# Patient Record
Sex: Female | Born: 1955 | Race: White | Hispanic: No | Smoking: Never smoker
Health system: Southern US, Community
[De-identification: ages and names within clinical notes are randomized; demographics above are authoritative.]

## PROBLEM LIST (undated history)

## (undated) DIAGNOSIS — R112 Nausea with vomiting, unspecified: Secondary | ICD-10-CM

## (undated) DIAGNOSIS — F319 Bipolar disorder, unspecified: Secondary | ICD-10-CM

## (undated) DIAGNOSIS — G2581 Restless legs syndrome: Secondary | ICD-10-CM

## (undated) DIAGNOSIS — Z87442 Personal history of urinary calculi: Secondary | ICD-10-CM

## (undated) DIAGNOSIS — F32A Depression, unspecified: Secondary | ICD-10-CM

## (undated) DIAGNOSIS — F329 Major depressive disorder, single episode, unspecified: Secondary | ICD-10-CM

## (undated) DIAGNOSIS — R569 Unspecified convulsions: Secondary | ICD-10-CM

## (undated) DIAGNOSIS — Z9889 Other specified postprocedural states: Secondary | ICD-10-CM

## (undated) DIAGNOSIS — I10 Essential (primary) hypertension: Secondary | ICD-10-CM

## (undated) DIAGNOSIS — N200 Calculus of kidney: Secondary | ICD-10-CM

## (undated) DIAGNOSIS — E7800 Pure hypercholesterolemia, unspecified: Secondary | ICD-10-CM

## (undated) DIAGNOSIS — F419 Anxiety disorder, unspecified: Secondary | ICD-10-CM

## (undated) DIAGNOSIS — M1990 Unspecified osteoarthritis, unspecified site: Secondary | ICD-10-CM

## (undated) DIAGNOSIS — K219 Gastro-esophageal reflux disease without esophagitis: Secondary | ICD-10-CM

## (undated) HISTORY — PX: JOINT REPLACEMENT: SHX530

## (undated) HISTORY — PX: CHOLECYSTECTOMY: SHX55

## (undated) HISTORY — PX: TONSILLECTOMY: SUR1361

---
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
## 1989-12-23 DIAGNOSIS — R112 Nausea with vomiting, unspecified: Secondary | ICD-10-CM

## 1989-12-23 DIAGNOSIS — Z9889 Other specified postprocedural states: Secondary | ICD-10-CM

## 1989-12-23 HISTORY — DX: Nausea with vomiting, unspecified: R11.2

## 1989-12-23 HISTORY — DX: Other specified postprocedural states: Z98.890

## 1989-12-23 HISTORY — PX: BRAIN SURGERY: SHX531

## 2004-10-01 ENCOUNTER — Ambulatory Visit: Payer: Self-pay | Admitting: Family Medicine

## 2006-04-08 ENCOUNTER — Ambulatory Visit: Payer: Self-pay | Admitting: *Deleted

## 2006-09-12 ENCOUNTER — Ambulatory Visit: Payer: Self-pay | Admitting: *Deleted

## 2006-09-25 ENCOUNTER — Ambulatory Visit: Payer: Self-pay | Admitting: *Deleted

## 2007-02-09 ENCOUNTER — Inpatient Hospital Stay: Payer: Self-pay | Admitting: Internal Medicine

## 2007-02-09 ENCOUNTER — Other Ambulatory Visit: Payer: Self-pay

## 2007-02-19 ENCOUNTER — Ambulatory Visit: Payer: Self-pay | Admitting: *Deleted

## 2007-06-11 ENCOUNTER — Ambulatory Visit: Payer: Self-pay | Admitting: Urology

## 2007-10-13 ENCOUNTER — Ambulatory Visit: Payer: Self-pay | Admitting: *Deleted

## 2007-12-09 ENCOUNTER — Ambulatory Visit: Payer: Self-pay | Admitting: Urology

## 2008-03-17 ENCOUNTER — Ambulatory Visit: Payer: Self-pay | Admitting: Family Medicine

## 2008-05-13 IMAGING — CT CT ABDOMEN AND PELVIS WITHOUT AND WITH CONTRAST
2 of 4 series · 14 of 32 positions shown, 19 images · non-contrast
Comparison: none

REASON FOR EXAM: (1) Hydronephrosis, Colic, Nephrolithiasis; (2)
Hydronephrosis, Colic, Nephrolit
COMMENTS:

[Series 3: with · axial · 0.71mm/px · z∈[-871,-535]mm · 8 of 56 slices shown, 13 images]
[im 7/56  soft-tissue]
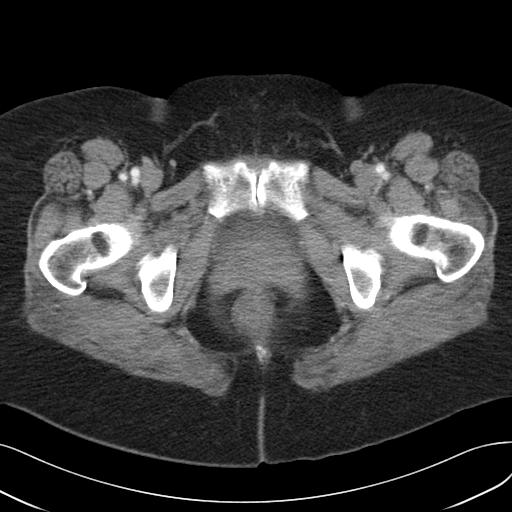
[im 7/56  bone]
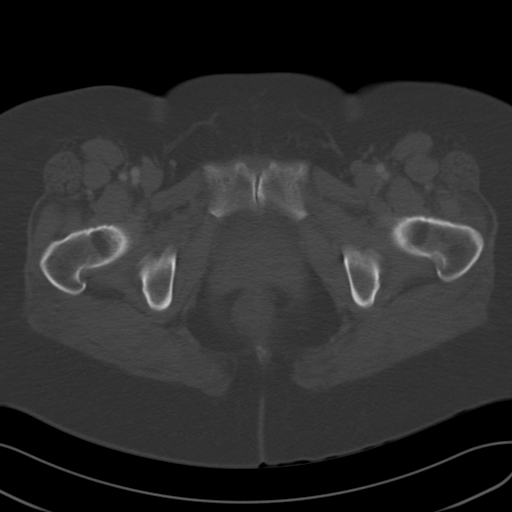
[im 13/56  soft-tissue]
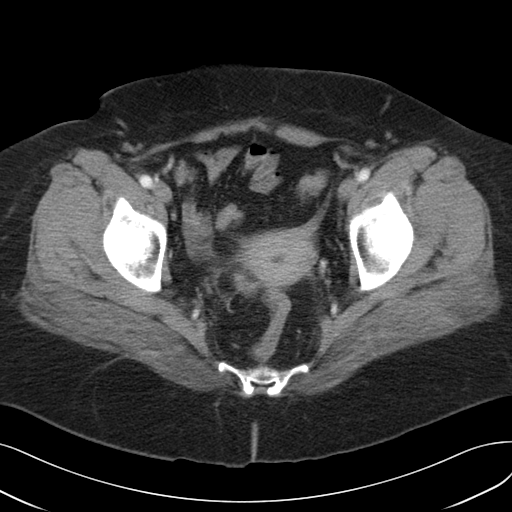
[im 19/56  soft-tissue]
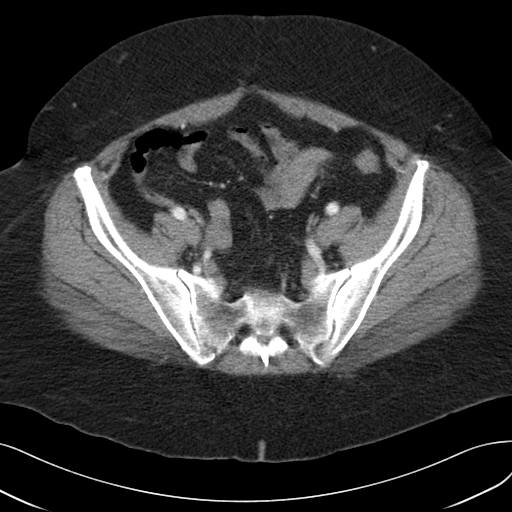
[im 25/56  soft-tissue]
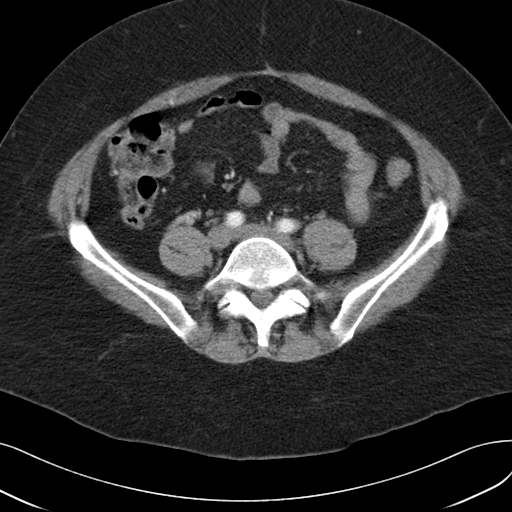
[im 31/56  soft-tissue]
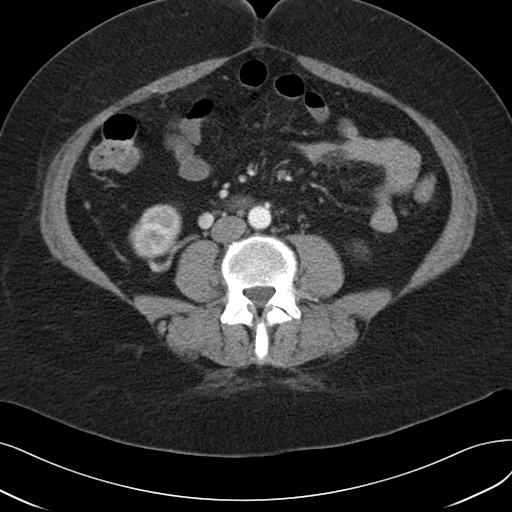
[im 31/56  lung]
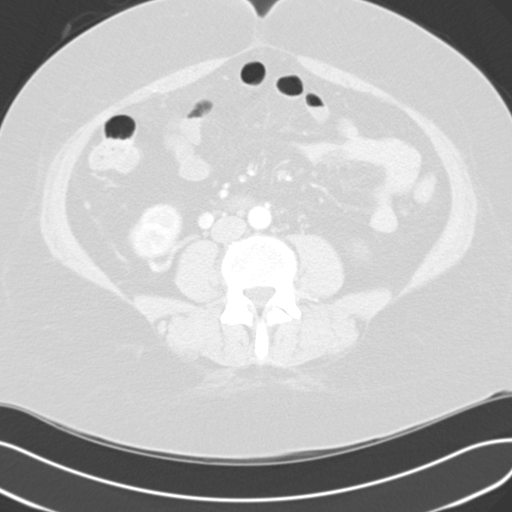
[im 37/56  soft-tissue]
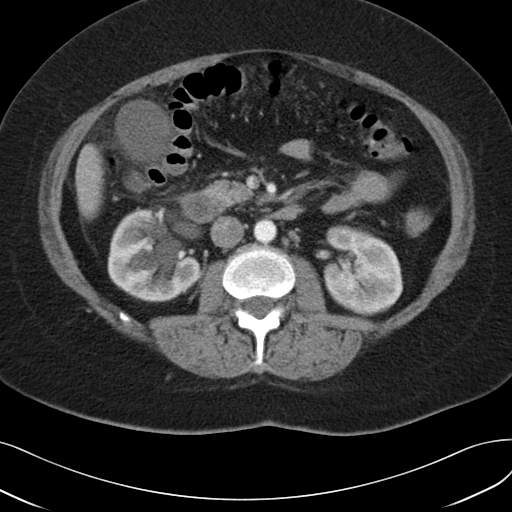
[im 37/56  lung]
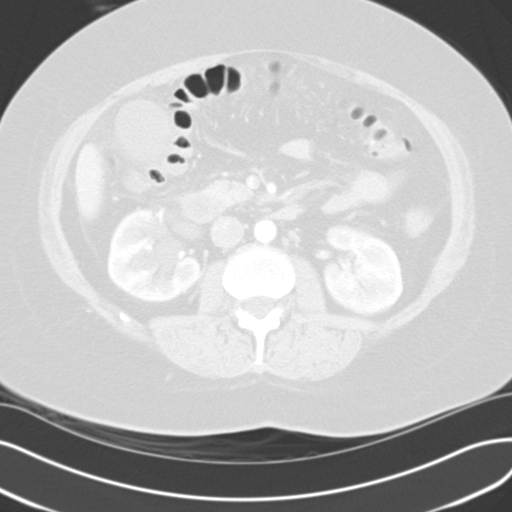
[im 43/56  soft-tissue]
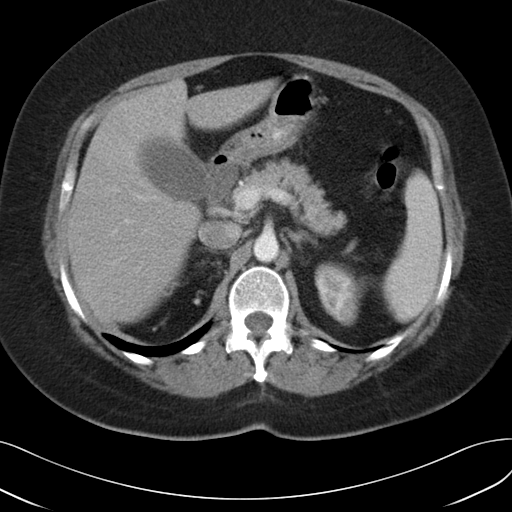
[im 43/56  lung]
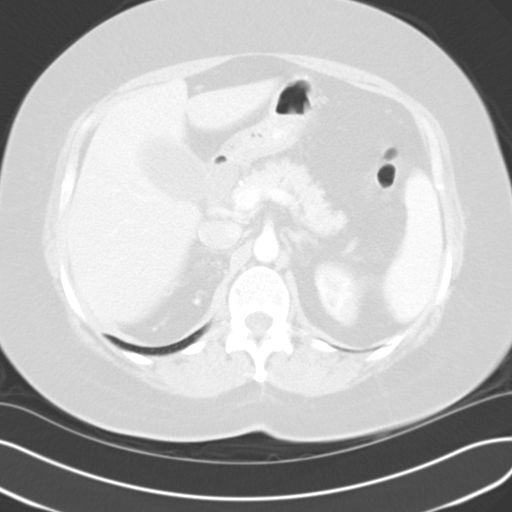
[im 49/56  soft-tissue]
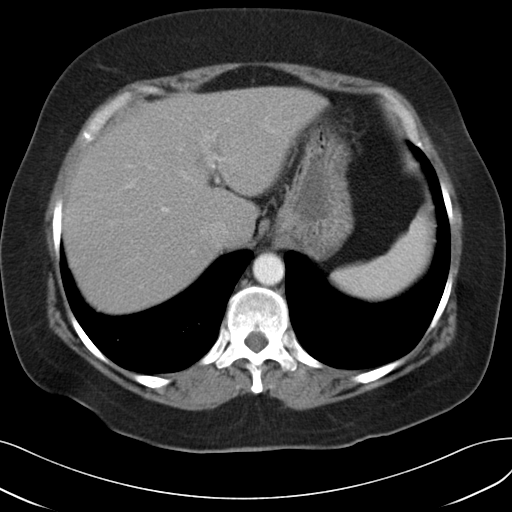
[im 49/56  lung]
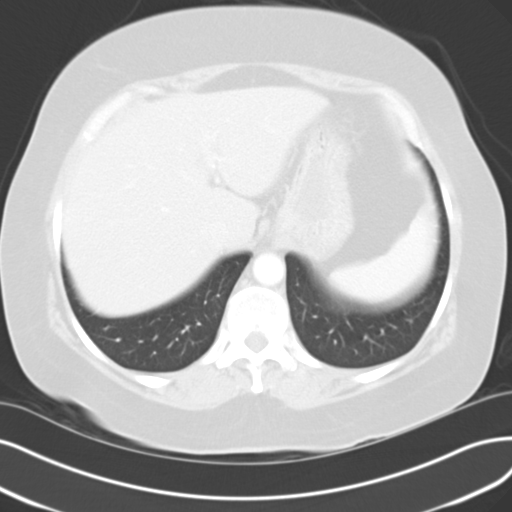

[Series 4: delay · axial · delayed · 0.71mm/px · z∈[-871,-631]mm · 6 of 56 slices shown]
[im 7/56  soft-tissue]
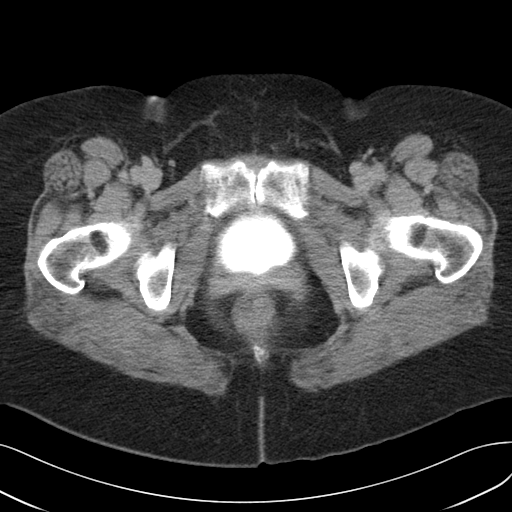
[im 13/56  soft-tissue]
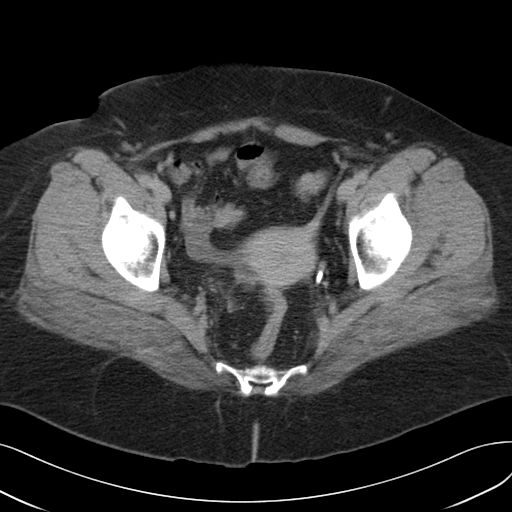
[im 19/56  soft-tissue]
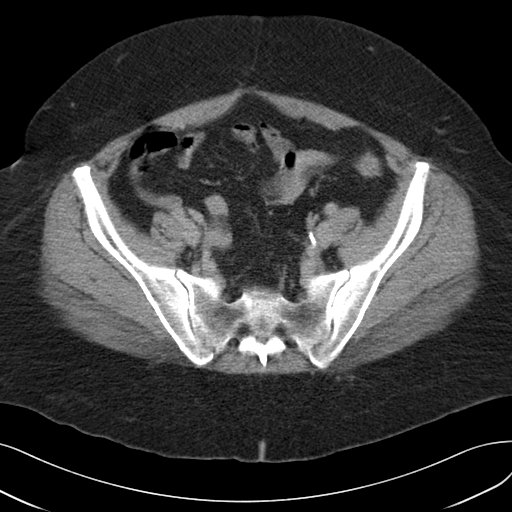
[im 25/56  soft-tissue]
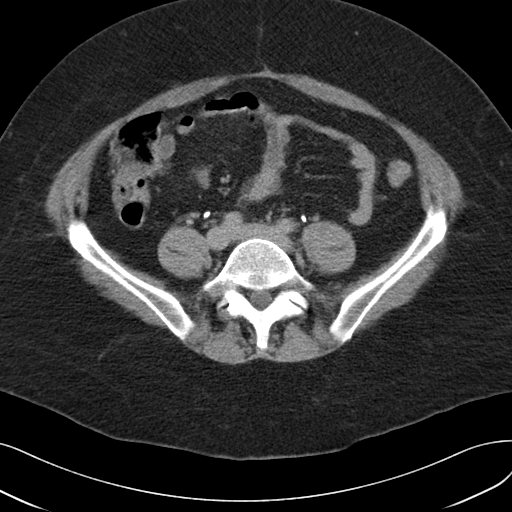
[im 31/56  soft-tissue]
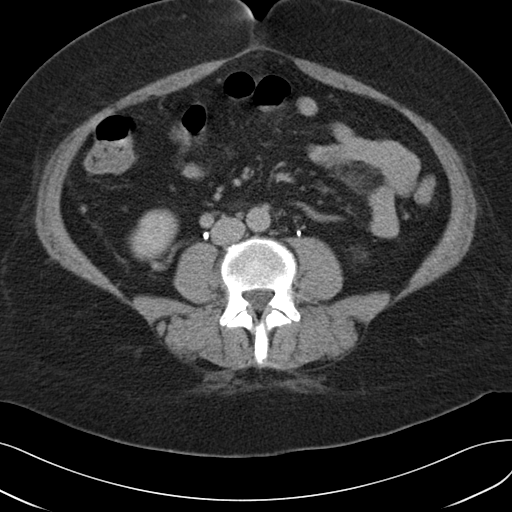
[im 37/56  soft-tissue]
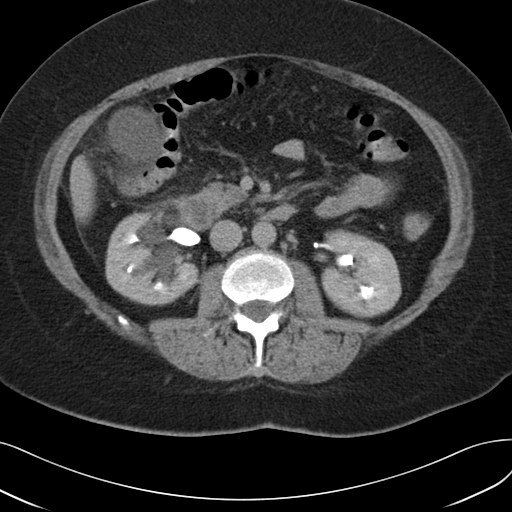

[14 of 32 positions shown; findings below may reference images not displayed]

PROCEDURE:     CT  - CT ABDOMEN / PELVIS  W/WO  - February 11, 2007  [DATE]

RESULT:     Triphasic multislice helical acquisition through the abdomen and
pelvis is performed. The precontrast images show a small stone anteriorly in
the midportion of the right kidney on image 24 measuring approximately 3 mm
in diameter. No additional stones are evident. Delayed images following
contrast administration showed multiple parapelvic cysts on the right. There
is no hydroureter or hydronephrosis present. The uterus is unremarkable in
appearance. The bladder opacifies with contrast-enhanced urine on the
delayed images. The lung bases are clear. The kidneys show no focal masses.
The liver and spleen appear normal. Pancreas is unremarkable. The
gallbladder is present. No radiopaque gallstones are evident. There is no
inflammatory stranding. No abnormal bowel distention is present. There is no
free fluid, free air or loculated fluid collection.
IMPRESSION: 1. Nonobstructing calyceal stone in the right kidney.
2. Multiple parapelvic cysts on the right. No evidence of hydroureter or
hydronephrosis.

## 2009-03-20 ENCOUNTER — Ambulatory Visit: Payer: Self-pay | Admitting: Family Medicine

## 2009-10-09 ENCOUNTER — Ambulatory Visit: Payer: Self-pay | Admitting: Family Medicine

## 2009-11-28 ENCOUNTER — Ambulatory Visit: Payer: Self-pay | Admitting: Family Medicine

## 2010-05-14 ENCOUNTER — Ambulatory Visit: Payer: Self-pay | Admitting: Family Medicine

## 2011-08-15 ENCOUNTER — Ambulatory Visit: Payer: Self-pay | Admitting: Family Medicine

## 2012-08-18 ENCOUNTER — Ambulatory Visit: Payer: Self-pay | Admitting: Family Medicine

## 2013-08-20 ENCOUNTER — Ambulatory Visit: Payer: Self-pay | Admitting: Family Medicine

## 2013-08-25 ENCOUNTER — Ambulatory Visit: Payer: Self-pay | Admitting: Family Medicine

## 2013-09-17 ENCOUNTER — Ambulatory Visit: Payer: Self-pay | Admitting: Family Medicine

## 2014-06-19 ENCOUNTER — Ambulatory Visit: Payer: Self-pay | Admitting: Physician Assistant

## 2014-06-26 ENCOUNTER — Emergency Department: Payer: Self-pay | Admitting: Emergency Medicine

## 2014-06-27 LAB — BASIC METABOLIC PANEL
ANION GAP: 7 (ref 7–16)
BUN: 30 mg/dL — AB (ref 7–18)
CHLORIDE: 102 mmol/L (ref 98–107)
CO2: 28 mmol/L (ref 21–32)
CREATININE: 0.87 mg/dL (ref 0.60–1.30)
Calcium, Total: 9.4 mg/dL (ref 8.5–10.1)
EGFR (Non-African Amer.): >60
Glucose: 107 mg/dL — ABNORMAL HIGH (ref 65–99)
Osmolality: 280 (ref 275–301)
Potassium: 3.3 mmol/L — ABNORMAL LOW (ref 3.5–5.1)
SODIUM: 137 mmol/L (ref 136–145)

## 2014-06-27 LAB — CBC
HCT: 44.7 % (ref 35.0–47.0)
HGB: 14.5 g/dL (ref 12.0–16.0)
MCH: 30.2 pg (ref 26.0–34.0)
MCHC: 32.4 g/dL (ref 32.0–36.0)
MCV: 93 fL (ref 80–100)
Platelet: 356 10*3/uL (ref 150–440)
RBC: 4.81 10*6/uL (ref 3.80–5.20)
RDW: 13.7 % (ref 11.5–14.5)
WBC: 10 10*3/uL (ref 3.6–11.0)

## 2014-06-27 LAB — D-DIMER(ARMC): D-Dimer: 809 ng/ml

## 2014-07-09 ENCOUNTER — Emergency Department: Payer: Self-pay | Admitting: Internal Medicine

## 2014-07-09 LAB — COMPREHENSIVE METABOLIC PANEL
ALBUMIN: 3.6 g/dL (ref 3.4–5.0)
AST: 25 U/L (ref 15–37)
Alkaline Phosphatase: 103 U/L
Anion Gap: 8 (ref 7–16)
BUN: 12 mg/dL (ref 7–18)
Bilirubin,Total: 0.7 mg/dL (ref 0.2–1.0)
CHLORIDE: 103 mmol/L (ref 98–107)
Calcium, Total: 9.1 mg/dL (ref 8.5–10.1)
Co2: 27 mmol/L (ref 21–32)
Creatinine: 0.84 mg/dL (ref 0.60–1.30)
EGFR (Non-African Amer.): >60
Glucose: 99 mg/dL (ref 65–99)
Osmolality: 275 (ref 275–301)
Potassium: 3.6 mmol/L (ref 3.5–5.1)
SGPT (ALT): 30 U/L (ref 12–78)
SODIUM: 138 mmol/L (ref 136–145)
TOTAL PROTEIN: 7.4 g/dL (ref 6.4–8.2)

## 2014-07-09 LAB — CBC
HCT: 38.9 % (ref 35.0–47.0)
HGB: 12.8 g/dL (ref 12.0–16.0)
MCH: 31 pg (ref 26.0–34.0)
MCHC: 32.9 g/dL (ref 32.0–36.0)
MCV: 94 fL (ref 80–100)
Platelet: 278 10*3/uL (ref 150–440)
RBC: 4.14 10*6/uL (ref 3.80–5.20)
RDW: 13.7 % (ref 11.5–14.5)
WBC: 5 10*3/uL (ref 3.6–11.0)

## 2014-07-09 LAB — PROTIME-INR
INR: 0.9
Prothrombin Time: 12.1 secs (ref 11.5–14.7)

## 2015-01-02 ENCOUNTER — Ambulatory Visit: Payer: Self-pay | Admitting: Family Medicine

## 2015-07-17 ENCOUNTER — Ambulatory Visit
Admission: EM | Admit: 2015-07-17 | Discharge: 2015-07-17 | Disposition: A | Discharge status: Home / Self Care | Payer: Medicare Other | Attending: Internal Medicine | Admitting: Internal Medicine

## 2015-07-17 DIAGNOSIS — R7302 Impaired glucose tolerance (oral): Secondary | ICD-10-CM

## 2015-07-17 DIAGNOSIS — L237 Allergic contact dermatitis due to plants, except food: Secondary | ICD-10-CM | POA: Diagnosis not present

## 2015-07-17 HISTORY — DX: Pure hypercholesterolemia, unspecified: E78.00

## 2015-07-17 HISTORY — DX: Restless legs syndrome: G25.81

## 2015-07-17 HISTORY — DX: Essential (primary) hypertension: I10

## 2015-07-17 MED ORDER — MUPIROCIN 2 % EX OINT: 1.0000 "application " | TOPICAL_OINTMENT | Freq: Two times a day (BID) | CUTANEOUS | Status: DC

## 2015-07-17 MED ORDER — PREDNISONE 50 MG PO TABS: 50.0000 mg | ORAL_TABLET | Freq: Every day | ORAL | Status: DC

## 2015-07-17 MED ORDER — METHYLPREDNISOLONE SODIUM SUCC 125 MG IJ SOLR
125.0000 mg | Freq: Once | INTRAMUSCULAR | Status: AC
  Administered 2015-07-17: 125 mg via INTRAMUSCULAR

## 2015-07-17 NOTE — ED Provider Notes (Signed)
CSN: [id number]     Arrival date & time 07/17/15  2841 History   First MD Initiated Contact with Patient 07/17/15 1059     Chief Complaint  Patient presents with  . Rash   HPI 59 year old lady who presents today after working in the yard several days ago, has now developed multiple itchy red patches on arms and legs. No fever, no malaise, no vomiting. Feels well otherwise.   Past Medical History  Diagnosis Date  . Hypertension   . Restless leg   . Hypercholesteremia        Glucose intolerance, since ?2014, per patient report Past Surgical History  Procedure Laterality Date  . Cesarean section    . Cholecystectomy    . Brain surgery      History  Substance Use Topics  . Smoking status: Never Smoker   . Smokeless tobacco: Not on file  . Alcohol Use: No    Review of Systems  All other systems reviewed and are negative.   Allergies  Mellaril  Home Medications   Prior to Admission medications   Medication Sig Start Date End Date Taking? Authorizing Provider  amitriptyline (ELAVIL) 10 MG tablet Take 10 mg by mouth at bedtime.   Yes Historical Provider, MD  aspirin 81 MG tablet Take 81 mg by mouth daily.   Yes Historical Provider, MD  gabapentin (NEURONTIN) 100 MG capsule Take 100 mg by mouth 3 (three) times daily.   Yes Historical Provider, MD  lisinopril-hydrochlorothiazide (PRINZIDE,ZESTORETIC) 10-12.5 MG per tablet Take 1 tablet by mouth daily.   Yes Historical Provider, MD  propranolol (INDERAL) 10 MG tablet Take 10 mg by mouth 3 (three) times daily.   Yes Historical Provider, MD  venlafaxine (EFFEXOR) 100 MG tablet Take 100 mg by mouth 2 (two) times daily.   Yes Historical Provider, MD  mupirocin ointment (BACTROBAN) 2 % Apply 1 application topically 2 (two) times daily. 07/17/15   Eustace Moore, MD  predniSONE (DELTASONE) 50 MG tablet Take 1 tablet (50 mg total) by mouth daily. 07/17/15   Eustace Moore, MD   BP 123/69 mmHg  Pulse 66  Temp(Src) 97.5 F (36.4 C)  (Tympanic)  Resp 16  Ht  (1.6 m)  Wt 270 lb (122.471 kg)  BMI 47.84 kg/m2  SpO2 99%  LMP  Physical Exam  Constitutional: She is oriented to person, place, and time. No distress.  Alert, nicely groomed Cheerful, sitting up in chair  HENT:  Head: Atraumatic.  Eyes:  Conjugate gaze, no eye redness/drainage  Neck: Neck supple.  Cardiovascular: Normal rate.   Pulmonary/Chest: No respiratory distress.  Abdominal: She exhibits no distension.  Musculoskeletal: Normal range of motion.  Symmetric, 1-2+ puffy bilateral lower leg swelling  Neurological: She is alert and oriented to person, place, and time.  Skin: Skin is warm and dry.  No cyanosis Lower legs and ankles, forearms, with erythematous patches and streaks, many with vesicles  Nursing note and vitals reviewed.   ED Course  Procedures  Solu-Medrol 125 mg IM given at the urgent care  MDM   1. Poison ivy dermatitis   2. Glucose intolerance (impaired glucose tolerance)    patient encouraged to monitor her blood sugars. Prescriptions for prednisone burst and topical Bactroban sent to the pharmacy. Patient should recheck or follow-up PCP/[facility] for increasing redness/pain/seeping, new fever greater than 100.5, or if not improving in several days.    Eustace Moore, MD 07/17/15 [phone]

## 2015-07-17 NOTE — ED Notes (Signed)
Pt states "I have poison oak, I been out cleaning greenery off fence." Rash present over arms and legs.

## 2015-07-17 NOTE — Discharge Instructions (Signed)
Injection of solumedrol (methylprednisolone, 125 mg) was given today. Prescriptions for prednisone and mupirocin ointment (antibacterial) were sent to the pharmacy. Rechecking for increasing redness/pain/seeping, fever >100.5, or if not improving in several days.  Contact Dermatitis Contact dermatitis is a rash that happens when something touches the skin. You touched something that irritates your skin, or you have allergies to something you touched. HOME CARE   Avoid the thing that caused your rash.  Keep your rash away from hot water, soap, sunlight, chemicals, and other things that might bother it.  Do not scratch your rash.  You can take cool baths to help stop itching.  Only take medicine as told by your doctor.  Keep all doctor visits as told. GET HELP RIGHT AWAY IF:   Your rash is not better after 3 days.  Your rash gets worse.  Your rash is puffy (swollen), tender, red, sore, or warm.  You have problems with your medicine. MAKE SURE YOU:   Understand these instructions.  Will watch your condition.  Will get help right away if you are not doing well or get worse. Document Released: 10/06/2009 Document Revised: 03/02/2012 Document Reviewed: 05/14/2011 [facility] Patient Information 2015 [place], [place]. This information is not intended to replace advice given to you by your health care provider. Make sure you discuss any questions you have with your health care provider.

## 2015-11-01 ENCOUNTER — Encounter
Admission: RE | Admit: 2015-11-01 | Discharge: 2015-11-01 | Disposition: A | Discharge status: Home / Self Care | Payer: Medicare Other | Source: Ambulatory Visit | Attending: Surgery | Admitting: Surgery

## 2015-11-01 DIAGNOSIS — Z01810 Encounter for preprocedural cardiovascular examination: Secondary | ICD-10-CM | POA: Diagnosis present

## 2015-11-01 DIAGNOSIS — Z01812 Encounter for preprocedural laboratory examination: Secondary | ICD-10-CM | POA: Diagnosis not present

## 2015-11-01 HISTORY — DX: Nausea with vomiting, unspecified: R11.2

## 2015-11-01 HISTORY — DX: Gastro-esophageal reflux disease without esophagitis: K21.9

## 2015-11-01 HISTORY — DX: Unspecified convulsions: R56.9

## 2015-11-01 HISTORY — DX: Unspecified osteoarthritis, unspecified site: M19.90

## 2015-11-01 HISTORY — DX: Bipolar disorder, unspecified: F31.9

## 2015-11-01 HISTORY — DX: Depression, unspecified: F32.A

## 2015-11-01 HISTORY — DX: Anxiety disorder, unspecified: F41.9

## 2015-11-01 HISTORY — DX: Other specified postprocedural states: Z98.890

## 2015-11-01 HISTORY — DX: Major depressive disorder, single episode, unspecified: F32.9

## 2015-11-01 HISTORY — DX: Calculus of kidney: N20.0

## 2015-11-01 LAB — URINALYSIS COMPLETE WITH MICROSCOPIC (ARMC ONLY)
Bacteria, UA: NONE SEEN
Bilirubin Urine: NEGATIVE
Glucose, UA: NEGATIVE mg/dL
KETONES UR: NEGATIVE mg/dL
Nitrite: NEGATIVE
PH: 7 (ref 5.0–8.0)
PROTEIN: NEGATIVE mg/dL
SPECIFIC GRAVITY, URINE: 1.008 (ref 1.005–1.030)
SQUAMOUS EPITHELIAL / LPF: NONE SEEN

## 2015-11-01 LAB — PROTIME-INR
INR: 1.02
Prothrombin Time: 13.6 seconds (ref 11.4–15.0)

## 2015-11-01 LAB — BASIC METABOLIC PANEL
Anion gap: 3 — ABNORMAL LOW (ref 5–15)
BUN: 15 mg/dL (ref 6–20)
CALCIUM: 9.2 mg/dL (ref 8.9–10.3)
CO2: 30 mmol/L (ref 22–32)
Chloride: 100 mmol/L — ABNORMAL LOW (ref 101–111)
Creatinine, Ser: 0.76 mg/dL (ref 0.44–1.00)
GFR calc Af Amer: >60 mL/min (ref >60)
GFR calc non Af Amer: >60 mL/min (ref >60)
GLUCOSE: 121 mg/dL — AB (ref 65–99)
Potassium: 3.6 mmol/L (ref 3.5–5.1)
Sodium: 133 mmol/L — ABNORMAL LOW (ref 135–145)

## 2015-11-01 LAB — SEDIMENTATION RATE: SED RATE: 44 mm/h — AB (ref 0–30)

## 2015-11-01 LAB — SURGICAL PCR SCREEN
MRSA, PCR: NEGATIVE
Staphylococcus aureus: POSITIVE — AB

## 2015-11-01 LAB — CBC
HCT: 35.4 % (ref 35.0–47.0)
Hemoglobin: 12 g/dL (ref 12.0–16.0)
MCH: 32.1 pg (ref 26.0–34.0)
MCHC: 33.9 g/dL (ref 32.0–36.0)
MCV: 94.7 fL (ref 80.0–100.0)
Platelets: 334 10*3/uL (ref 150–440)
RBC: 3.74 MIL/uL — ABNORMAL LOW (ref 3.80–5.20)
RDW: 13.2 % (ref 11.5–14.5)
WBC: 6.5 10*3/uL (ref 3.6–11.0)

## 2015-11-01 LAB — TYPE AND SCREEN
ABO/RH(D): O POS
Antibody Screen: NEGATIVE

## 2015-11-01 LAB — APTT: aPTT: 27 seconds (ref 24–36)

## 2015-11-01 LAB — ABO/RH: ABO/RH(D): O POS

## 2015-11-01 NOTE — Patient Instructions (Signed)
  Your procedure is scheduled on: Tuesday Nov. 15, 2016. Report to Same Day Surgery. To find out your arrival time please call [phone] between 1PM - 3PM on Monday Nov. 14, 2016.  Remember: Instructions that are not followed completely may result in serious medical risk, up to and including death, or upon the discretion of your surgeon and anesthesiologist your surgery may need to be rescheduled.    _x___ 1. Do not eat food or drink liquids after midnight. No gum chewing or hard candies.     _x___ 2. No Alcohol for 24 hours before or after surgery.   ____ 3. Bring all medications with you on the day of surgery if instructed.    __x__ 4. Notify your doctor if there is any change in your medical condition     (cold, fever, infections).     Do not wear jewelry, make-up, hairpins, clips or nail polish.  Do not wear lotions, powders, or perfumes. You may wear deodorant.  Do not shave 48 hours prior to surgery. Men may shave face and neck.  Do not bring valuables to the hospital.    [facility] any belongings or valuables.               Contacts, dentures or bridgework may not be worn into surgery.  Leave your suitcase in the car. After surgery it may be brought to your room.  For patients admitted to the hospital, discharge time is determined by your treatment team.   Patients discharged the day of surgery will not be allowed to drive home.    Please read over the following fact sheets that you were given:   [facility]  _x___ Take these medicines the morning of surgery with A SIP OF WATER:    1. gabapentin (NEURONTIN)  2. omeprazole (PRILOSEC)  3. propranolol (INDERAL)  4. venlafaxine (EFFEXOR)   ____ Fleet Enema (as directed)   _x___ Use CHG Soap as directed  ____ Use inhalers on the day of surgery  ____ Stop metformin 2 days prior to surgery    ____ Take 1/2 of usual insulin dose the night before surgery and none on the  morning of surgery.   _x___ Stop aspirin now.  _x___ Stop Anti-inflammatories Aleve, Ibuprofen, advil, motrin now. OK to take Tyelnol for pain.   __x__ Stop supplements such as fish oil until after surgery.    ____ Bring C-Pap to the hospital.

## 2015-11-02 LAB — URINE CULTURE: SPECIAL REQUESTS: NORMAL

## 2015-11-02 NOTE — OR Nursing (Signed)
Dr. Binnie RailPoggi's Office notified of the +staph /MRSA swab

## 2015-11-07 ENCOUNTER — Inpatient Hospital Stay: Admission: RE | Admit: 2015-11-07 | Payer: Medicare Other | Source: Ambulatory Visit | Admitting: Surgery

## 2015-11-07 ENCOUNTER — Encounter: Admission: RE | Payer: Self-pay | Source: Ambulatory Visit

## 2015-11-07 SURGERY — ARTHROPLASTY, KNEE, TOTAL
Anesthesia: Choice | Laterality: Left

## 2015-11-21 ENCOUNTER — Inpatient Hospital Stay: Payer: Medicare Other | Admitting: Anesthesiology

## 2015-11-21 ENCOUNTER — Encounter: Payer: Self-pay | Admitting: *Deleted

## 2015-11-21 ENCOUNTER — Encounter
Admission: RE | Disposition: A | Discharge status: Home Health | Payer: Self-pay | Source: Ambulatory Visit | Attending: Surgery

## 2015-11-21 ENCOUNTER — Inpatient Hospital Stay
Admission: RE | Admit: 2015-11-21 | Discharge: 2015-11-23 | DRG: 470 | Disposition: A | Discharge status: Home Health | Payer: Medicare Other | Source: Ambulatory Visit | Attending: Surgery | Admitting: Surgery

## 2015-11-21 ENCOUNTER — Inpatient Hospital Stay: Payer: Medicare Other

## 2015-11-21 DIAGNOSIS — M1712 Unilateral primary osteoarthritis, left knee: Principal | ICD-10-CM | POA: Diagnosis present

## 2015-11-21 DIAGNOSIS — K219 Gastro-esophageal reflux disease without esophagitis: Secondary | ICD-10-CM | POA: Diagnosis present

## 2015-11-21 DIAGNOSIS — M7062 Trochanteric bursitis, left hip: Secondary | ICD-10-CM | POA: Diagnosis present

## 2015-11-21 DIAGNOSIS — Z87442 Personal history of urinary calculi: Secondary | ICD-10-CM | POA: Diagnosis not present

## 2015-11-21 DIAGNOSIS — Z79890 Hormone replacement therapy (postmenopausal): Secondary | ICD-10-CM | POA: Diagnosis not present

## 2015-11-21 DIAGNOSIS — I10 Essential (primary) hypertension: Secondary | ICD-10-CM | POA: Diagnosis present

## 2015-11-21 DIAGNOSIS — Z79899 Other long term (current) drug therapy: Secondary | ICD-10-CM | POA: Diagnosis not present

## 2015-11-21 DIAGNOSIS — F319 Bipolar disorder, unspecified: Secondary | ICD-10-CM | POA: Diagnosis present

## 2015-11-21 DIAGNOSIS — G2581 Restless legs syndrome: Secondary | ICD-10-CM | POA: Diagnosis present

## 2015-11-21 DIAGNOSIS — Z6841 Body Mass Index (BMI) 40.0 and over, adult: Secondary | ICD-10-CM

## 2015-11-21 DIAGNOSIS — Z96652 Presence of left artificial knee joint: Secondary | ICD-10-CM

## 2015-11-21 DIAGNOSIS — F419 Anxiety disorder, unspecified: Secondary | ICD-10-CM | POA: Diagnosis present

## 2015-11-21 DIAGNOSIS — M7061 Trochanteric bursitis, right hip: Secondary | ICD-10-CM | POA: Diagnosis present

## 2015-11-21 DIAGNOSIS — Z7982 Long term (current) use of aspirin: Secondary | ICD-10-CM | POA: Diagnosis not present

## 2015-11-21 DIAGNOSIS — E7800 Pure hypercholesterolemia, unspecified: Secondary | ICD-10-CM | POA: Diagnosis present

## 2015-11-21 DIAGNOSIS — E669 Obesity, unspecified: Secondary | ICD-10-CM | POA: Diagnosis present

## 2015-11-21 HISTORY — PX: TOTAL KNEE ARTHROPLASTY: SHX125

## 2015-11-21 LAB — TYPE AND SCREEN
ABO/RH(D): O POS
Antibody Screen: NEGATIVE

## 2015-11-21 SURGERY — ARTHROPLASTY, KNEE, TOTAL
Anesthesia: Spinal | Site: Knee | Laterality: Left | Wound class: Clean

## 2015-11-21 MED ORDER — ONDANSETRON HCL 4 MG PO TABS: 4.0000 mg | ORAL_TABLET | Freq: Four times a day (QID) | ORAL | Status: DC | PRN

## 2015-11-21 MED ORDER — MIDAZOLAM HCL 5 MG/5ML IJ SOLN
INTRAMUSCULAR | Status: DC | PRN
Start: 2015-11-21 — End: 2015-11-21
  Administered 2015-11-21 (×2): 1 mg via INTRAVENOUS

## 2015-11-21 MED ORDER — FENTANYL CITRATE (PF) 100 MCG/2ML IJ SOLN: 25.0000 ug | INTRAMUSCULAR | Status: DC | PRN

## 2015-11-21 MED ORDER — PROPRANOLOL HCL 20 MG PO TABS
60.0000 mg | ORAL_TABLET | Freq: Every day | ORAL | Status: DC
  Administered 2015-11-22 – 2015-11-23 (×2): 60 mg via ORAL
  Filled 2015-11-21 (×2): qty 3

## 2015-11-21 MED ORDER — SODIUM CHLORIDE 0.9 % IV SOLN
INTRAVENOUS | Status: DC | PRN
  Administered 2015-11-21: 60 mL

## 2015-11-21 MED ORDER — ROPINIROLE HCL 1 MG PO TABS
3.0000 mg | ORAL_TABLET | Freq: Every day | ORAL | Status: DC
  Administered 2015-11-21 – 2015-11-22 (×2): 3 mg via ORAL
  Filled 2015-11-21 (×3): qty 3

## 2015-11-21 MED ORDER — RISPERIDONE 0.5 MG PO TABS
0.5000 mg | ORAL_TABLET | Freq: Every day | ORAL | Status: DC
  Administered 2015-11-21 – 2015-11-22 (×2): 0.5 mg via ORAL
  Filled 2015-11-21 (×2): qty 1

## 2015-11-21 MED ORDER — ACETAMINOPHEN 325 MG PO TABS: 650.0000 mg | ORAL_TABLET | Freq: Four times a day (QID) | ORAL | Status: DC | PRN

## 2015-11-21 MED ORDER — ENOXAPARIN SODIUM 30 MG/0.3ML ~~LOC~~ SOLN
30.0000 mg | Freq: Two times a day (BID) | SUBCUTANEOUS | Status: DC
  Administered 2015-11-22 – 2015-11-23 (×3): 30 mg via SUBCUTANEOUS
  Filled 2015-11-21 (×3): qty 0.3

## 2015-11-21 MED ORDER — PRAVASTATIN SODIUM 20 MG PO TABS
10.0000 mg | ORAL_TABLET | Freq: Every evening | ORAL | Status: DC
  Administered 2015-11-21 – 2015-11-22 (×2): 10 mg via ORAL
  Filled 2015-11-21 (×2): qty 1

## 2015-11-21 MED ORDER — FLEET ENEMA 7-19 GM/118ML RE ENEM: 1.0000 | ENEMA | Freq: Once | RECTAL | Status: DC | PRN

## 2015-11-21 MED ORDER — PROPOFOL 500 MG/50ML IV EMUL
INTRAVENOUS | Status: DC | PRN
  Administered 2015-11-21: 50 ug/kg/min via INTRAVENOUS

## 2015-11-21 MED ORDER — BUPIVACAINE-EPINEPHRINE (PF) 0.5% -1:200000 IJ SOLN
INTRAMUSCULAR | Status: AC
  Filled 2015-11-21: qty 30

## 2015-11-21 MED ORDER — NEOMYCIN-POLYMYXIN B GU 40-200000 IR SOLN
Status: DC | PRN
  Administered 2015-11-21: 16 mL

## 2015-11-21 MED ORDER — FENTANYL CITRATE (PF) 100 MCG/2ML IJ SOLN
INTRAMUSCULAR | Status: DC | PRN
  Administered 2015-11-21: 50 ug via INTRAVENOUS

## 2015-11-21 MED ORDER — HYDROCHLOROTHIAZIDE 12.5 MG PO CAPS: 12.5000 mg | ORAL_CAPSULE | Freq: Every day | ORAL | Status: DC

## 2015-11-21 MED ORDER — LISINOPRIL 10 MG PO TABS: 10.0000 mg | ORAL_TABLET | Freq: Every day | ORAL | Status: DC

## 2015-11-21 MED ORDER — METOCLOPRAMIDE HCL 5 MG/ML IJ SOLN: 5.0000 mg | Freq: Three times a day (TID) | INTRAMUSCULAR | Status: DC | PRN

## 2015-11-21 MED ORDER — BISACODYL 10 MG RE SUPP: 10.0000 mg | Freq: Every day | RECTAL | Status: DC | PRN

## 2015-11-21 MED ORDER — BUPIVACAINE-EPINEPHRINE (PF) 0.5% -1:200000 IJ SOLN
INTRAMUSCULAR | Status: DC | PRN
  Administered 2015-11-21: 30 mL

## 2015-11-21 MED ORDER — NEOMYCIN-POLYMYXIN B GU 40-200000 IR SOLN
Status: AC
  Filled 2015-11-21: qty 20

## 2015-11-21 MED ORDER — LORAZEPAM 1 MG PO TABS: 1.0000 mg | ORAL_TABLET | Freq: Four times a day (QID) | ORAL | Status: DC | PRN

## 2015-11-21 MED ORDER — VANCOMYCIN HCL IN DEXTROSE 1-5 GM/200ML-% IV SOLN
1000.0000 mg | Freq: Two times a day (BID) | INTRAVENOUS | Status: AC
  Administered 2015-11-21 – 2015-11-22 (×2): 1000 mg via INTRAVENOUS
  Filled 2015-11-21 (×2): qty 200

## 2015-11-21 MED ORDER — DOCUSATE SODIUM 100 MG PO CAPS
100.0000 mg | ORAL_CAPSULE | Freq: Two times a day (BID) | ORAL | Status: DC
Start: 2015-11-21 — End: 2015-11-23
  Administered 2015-11-22 – 2015-11-23 (×3): 100 mg via ORAL
  Filled 2015-11-21 (×4): qty 1

## 2015-11-21 MED ORDER — VENLAFAXINE HCL ER 75 MG PO CP24
150.0000 mg | ORAL_CAPSULE | Freq: Every day | ORAL | Status: DC
  Administered 2015-11-22 – 2015-11-23 (×2): 150 mg via ORAL
  Filled 2015-11-21 (×2): qty 2

## 2015-11-21 MED ORDER — TRANEXAMIC ACID 1000 MG/10ML IV SOLN
INTRAVENOUS | Status: AC
  Filled 2015-11-21: qty 10

## 2015-11-21 MED ORDER — OXYCODONE HCL 5 MG PO TABS
5.0000 mg | ORAL_TABLET | ORAL | Status: DC | PRN
  Administered 2015-11-22 – 2015-11-23 (×4): 5 mg via ORAL
  Administered 2015-11-23: 10 mg via ORAL
  Filled 2015-11-21 (×2): qty 1
  Filled 2015-11-21: qty 2
  Filled 2015-11-21 (×2): qty 1

## 2015-11-21 MED ORDER — ACETAMINOPHEN 650 MG RE SUPP: 650.0000 mg | Freq: Four times a day (QID) | RECTAL | Status: DC | PRN

## 2015-11-21 MED ORDER — CALCIUM CARBONATE-VITAMIN D 500-200 MG-UNIT PO TABS
1.0000 | ORAL_TABLET | Freq: Two times a day (BID) | ORAL | Status: DC
  Administered 2015-11-21 – 2015-11-23 (×4): 1 via ORAL
  Filled 2015-11-21 (×4): qty 1

## 2015-11-21 MED ORDER — DIPHENHYDRAMINE HCL 12.5 MG/5ML PO ELIX: 12.5000 mg | ORAL_SOLUTION | ORAL | Status: DC | PRN

## 2015-11-21 MED ORDER — KETOROLAC TROMETHAMINE 15 MG/ML IJ SOLN
15.0000 mg | Freq: Four times a day (QID) | INTRAMUSCULAR | Status: AC
  Administered 2015-11-21 – 2015-11-22 (×6): 15 mg via INTRAVENOUS
  Filled 2015-11-21 (×6): qty 1

## 2015-11-21 MED ORDER — KCL IN DEXTROSE-NACL 20-5-0.9 MEQ/L-%-% IV SOLN
INTRAVENOUS | Status: DC
Start: 2015-11-21 — End: 2015-11-23
  Administered 2015-11-21 – 2015-11-22 (×2): via INTRAVENOUS
  Filled 2015-11-21 (×8): qty 1000

## 2015-11-21 MED ORDER — ONDANSETRON HCL 4 MG/2ML IJ SOLN: 4.0000 mg | Freq: Four times a day (QID) | INTRAMUSCULAR | Status: DC | PRN

## 2015-11-21 MED ORDER — METOCLOPRAMIDE HCL 5 MG PO TABS: 5.0000 mg | ORAL_TABLET | Freq: Three times a day (TID) | ORAL | Status: DC | PRN

## 2015-11-21 MED ORDER — HYDROCHLOROTHIAZIDE 12.5 MG PO CAPS
12.5000 mg | ORAL_CAPSULE | Freq: Every day | ORAL | Status: DC
  Administered 2015-11-22 – 2015-11-23 (×2): 12.5 mg via ORAL
  Filled 2015-11-21 (×2): qty 1

## 2015-11-21 MED ORDER — OMEGA-3-ACID ETHYL ESTERS 1 G PO CAPS
1.0000 g | ORAL_CAPSULE | Freq: Every day | ORAL | Status: DC
  Administered 2015-11-22 – 2015-11-23 (×2): 1 g via ORAL
  Filled 2015-11-21 (×2): qty 1

## 2015-11-21 MED ORDER — FERROUS SULFATE 325 (65 FE) MG PO TABS
325.0000 mg | ORAL_TABLET | Freq: Two times a day (BID) | ORAL | Status: DC
  Administered 2015-11-21 – 2015-11-23 (×4): 325 mg via ORAL
  Filled 2015-11-21 (×4): qty 1

## 2015-11-21 MED ORDER — VENLAFAXINE HCL 25 MG PO TABS
100.0000 mg | ORAL_TABLET | Freq: Two times a day (BID) | ORAL | Status: DC
  Filled 2015-11-21 (×2): qty 2

## 2015-11-21 MED ORDER — VANCOMYCIN HCL 10 G IV SOLR
2000.0000 mg | Freq: Once | INTRAVENOUS | Status: DC
  Filled 2015-11-21: qty 2000

## 2015-11-21 MED ORDER — KETOROLAC TROMETHAMINE 30 MG/ML IJ SOLN
30.0000 mg | Freq: Once | INTRAMUSCULAR | Status: DC
  Filled 2015-11-21: qty 1

## 2015-11-21 MED ORDER — BUPIVACAINE LIPOSOME 1.3 % IJ SUSP
INTRAMUSCULAR | Status: AC
Start: 2015-11-21 — End: 2015-11-21
  Filled 2015-11-21: qty 20

## 2015-11-21 MED ORDER — GABAPENTIN 300 MG PO CAPS
300.0000 mg | ORAL_CAPSULE | Freq: Three times a day (TID) | ORAL | Status: DC
  Administered 2015-11-21 – 2015-11-23 (×6): 300 mg via ORAL
  Filled 2015-11-21 (×6): qty 1

## 2015-11-21 MED ORDER — MAGNESIUM HYDROXIDE 400 MG/5ML PO SUSP: 30.0000 mL | Freq: Every day | ORAL | Status: DC | PRN

## 2015-11-21 MED ORDER — ONDANSETRON HCL 4 MG/2ML IJ SOLN: 4.0000 mg | Freq: Once | INTRAMUSCULAR | Status: DC | PRN

## 2015-11-21 MED ORDER — ACETAMINOPHEN 500 MG PO TABS
1000.0000 mg | ORAL_TABLET | Freq: Four times a day (QID) | ORAL | Status: AC
  Administered 2015-11-21 – 2015-11-22 (×4): 1000 mg via ORAL
  Filled 2015-11-21 (×4): qty 2

## 2015-11-21 MED ORDER — TRANEXAMIC ACID 1000 MG/10ML IV SOLN
INTRAVENOUS | Status: DC | PRN
  Administered 2015-11-21: 1000 mg via TOPICAL

## 2015-11-21 MED ORDER — LISINOPRIL-HYDROCHLOROTHIAZIDE 10-12.5 MG PO TABS: 1.0000 | ORAL_TABLET | Freq: Every day | ORAL | Status: DC

## 2015-11-21 MED ORDER — AMITRIPTYLINE HCL 25 MG PO TABS
25.0000 mg | ORAL_TABLET | Freq: Every day | ORAL | Status: DC
  Administered 2015-11-21 – 2015-11-22 (×2): 25 mg via ORAL
  Filled 2015-11-21 (×2): qty 1

## 2015-11-21 MED ORDER — LISINOPRIL 20 MG PO TABS
20.0000 mg | ORAL_TABLET | Freq: Every day | ORAL | Status: DC
  Administered 2015-11-22 – 2015-11-23 (×2): 20 mg via ORAL
  Filled 2015-11-21 (×2): qty 1

## 2015-11-21 MED ORDER — LACTATED RINGERS IV SOLN
INTRAVENOUS | Status: DC
  Administered 2015-11-21 (×2): via INTRAVENOUS

## 2015-11-21 MED ORDER — HYDROMORPHONE HCL 1 MG/ML IJ SOLN
1.0000 mg | INTRAMUSCULAR | Status: DC | PRN
  Administered 2015-11-21: 1 mg via INTRAVENOUS
  Filled 2015-11-21: qty 1

## 2015-11-21 MED ORDER — SODIUM CHLORIDE 0.9 % IJ SOLN
INTRAMUSCULAR | Status: AC
  Filled 2015-11-21: qty 50

## 2015-11-21 MED ORDER — ASPIRIN 81 MG PO CHEW
81.0000 mg | CHEWABLE_TABLET | Freq: Two times a day (BID) | ORAL | Status: DC
  Administered 2015-11-21 – 2015-11-23 (×4): 81 mg via ORAL
  Filled 2015-11-21 (×8): qty 1

## 2015-11-21 MED ORDER — VANCOMYCIN HCL IN DEXTROSE 1-5 GM/200ML-% IV SOLN
INTRAVENOUS | Status: AC
  Administered 2015-11-21: 1 g via INTRAVENOUS
  Administered 2015-11-21: 1000 mg via INTRAVENOUS
  Filled 2015-11-21: qty 400

## 2015-11-21 MED ORDER — NEOMYCIN-POLYMYXIN B GU 40-200000 IR SOLN
Status: AC
  Filled 2015-11-21: qty 16

## 2015-11-21 SURGICAL SUPPLY — 51 items
BAG COUNTER SPONGE EZ (MISCELLANEOUS) IMPLANT
BLADE SAW SAG 25X90X1.19 (BLADE) ×3 IMPLANT
BLADE SURG SZ20 CARB STEEL (BLADE) ×3 IMPLANT
BNDG COHESIVE 6X5 TAN STRL LF (GAUZE/BANDAGES/DRESSINGS) ×3 IMPLANT
BONE CEMENT PALACOSE (Orthopedic Implant) ×6 IMPLANT
BOWL CEMENT MIX W/ADAPTER (MISCELLANEOUS) ×3 IMPLANT
CANISTER SUCT 1200ML W/VALVE (MISCELLANEOUS) ×3 IMPLANT
CANISTER SUCT 3000ML (MISCELLANEOUS) ×3 IMPLANT
CAPT KNEE TOTAL 3 ×3 IMPLANT
CATH TRAY METER 16FR LF (MISCELLANEOUS) ×3 IMPLANT
CEMENT BONE PALACOSE (Orthopedic Implant) ×2 IMPLANT
CHLORAPREP W/TINT 26ML (MISCELLANEOUS) ×3 IMPLANT
COOLER POLAR GLACIER W/PUMP (MISCELLANEOUS) ×3 IMPLANT
COUNTER SPONGE BAG EZ (MISCELLANEOUS)
COVER MAYO STAND STRL (DRAPES) ×3 IMPLANT
DRAPE INCISE IOBAN 66X45 STRL (DRAPES) ×6 IMPLANT
DRSG OPSITE POSTOP 4X12 (GAUZE/BANDAGES/DRESSINGS) ×3 IMPLANT
DRSG OPSITE POSTOP 4X14 (GAUZE/BANDAGES/DRESSINGS) ×3 IMPLANT
ELECT CAUTERY BLADE 6.4 (BLADE) ×3 IMPLANT
GLOVE BIO SURGEON STRL SZ7.5 (GLOVE) ×6 IMPLANT
GLOVE BIO SURGEON STRL SZ8 (GLOVE) ×6 IMPLANT
GLOVE BIOGEL PI IND STRL 8 (GLOVE) ×1 IMPLANT
GLOVE BIOGEL PI INDICATOR 8 (GLOVE) ×2
GLOVE INDICATOR 8.0 STRL GRN (GLOVE) ×9 IMPLANT
GOWN STRL REUS W/ TWL LRG LVL3 (GOWN DISPOSABLE) ×2 IMPLANT
GOWN STRL REUS W/ TWL XL LVL3 (GOWN DISPOSABLE) ×1 IMPLANT
GOWN STRL REUS W/TWL LRG LVL3 (GOWN DISPOSABLE) ×4
GOWN STRL REUS W/TWL XL LVL3 (GOWN DISPOSABLE) ×2
HANDPIECE SUCTION TUBG SURGILV (MISCELLANEOUS) ×3 IMPLANT
HOOD PEEL AWAY FLYTE STAYCOOL (MISCELLANEOUS) ×9 IMPLANT
IMMBOLIZER KNEE 19 BLUE UNIV (SOFTGOODS) ×3 IMPLANT
KIT RM TURNOVER STRD PROC AR (KITS) ×3 IMPLANT
NDL SAFETY 18GX1.5 (NEEDLE) ×3 IMPLANT
NEEDLE 18GX1X1/2 (RX/OR ONLY) (NEEDLE) ×3 IMPLANT
NEEDLE SPNL 20GX3.5 QUINCKE YW (NEEDLE) ×3 IMPLANT
NS IRRIG 1000ML POUR BTL (IV SOLUTION) ×3 IMPLANT
PACK TOTAL KNEE (MISCELLANEOUS) ×3 IMPLANT
PAD GROUND ADULT SPLIT (MISCELLANEOUS) ×3 IMPLANT
PAD WRAPON POLAR KNEE (MISCELLANEOUS) ×1 IMPLANT
PIN STEINMANN THREADED TIP (PIN) IMPLANT
SOL .9 NS 3000ML IRR  AL (IV SOLUTION) ×2
SOL .9 NS 3000ML IRR UROMATIC (IV SOLUTION) ×1 IMPLANT
STAPLER SKIN PROX 35W (STAPLE) ×3 IMPLANT
SUCTION FRAZIER TIP 10 FR DISP (SUCTIONS) ×3 IMPLANT
SUT VIC AB 0 CT1 36 (SUTURE) ×15 IMPLANT
SUT VIC AB 2-0 CT1 27 (SUTURE) ×10
SUT VIC AB 2-0 CT1 TAPERPNT 27 (SUTURE) ×5 IMPLANT
SYR 20CC LL (SYRINGE) ×3 IMPLANT
SYR 30ML LL (SYRINGE) ×6 IMPLANT
SYRINGE 10CC LL (SYRINGE) ×6 IMPLANT
WRAPON POLAR PAD KNEE (MISCELLANEOUS) ×3

## 2015-11-21 NOTE — Anesthesia Procedure Notes (Addendum)
Performed by: Malva CoganBEANE, CATHERINE Pre-anesthesia Checklist: Patient identified, Emergency Drugs available, Suction available, Patient being monitored and Timeout performed Oxygen Delivery Method: Simple face mask   Spinal Patient location during procedure: OR Staffing Anesthesiologist: Berdine AddisonHOMAS, Kekai Geter Performed by: anesthesiologist  Preanesthetic Checklist Completed: patient identified, site marked, surgical consent, pre-op evaluation, timeout performed, IV checked and risks and benefits discussed Spinal Block Patient position: sitting Prep: Betadine Patient monitoring: heart rate, cardiac monitor, continuous pulse ox and blood pressure Approach: midline Location: L3-4 Injection technique: single-shot Needle Needle type: Pencil-Tip  Needle gauge: 25 G Needle length: 9 cm Assessment Sensory level: T10 Additional Notes 2.5 ml of 0.5% marcaine intrathecal.

## 2015-11-21 NOTE — Evaluation (Signed)
Physical Therapy Evaluation Patient Details Name: Paula Hammond MRN: [id number] DOB: 12/25/1955 Today's Date: 11/21/2015   History of Present Illness  Pt underwent L TKR and is POD#0 at time of evaluation. No reported post-op complications  Clinical Impression  Pt is somewhat impulsive with therapist but is very pleasant to work with. She demonstrates excellent LLE strength and AAROM (0-104). Pt is able to perform bed mobility, transfers, and limited ambulation with CGA only. Pt able to complete all bed exercises as instructed. Pt will benefit from skilled PT services to address deficits in strength, balance, and mobility in order to return to full function at home.     Follow Up Recommendations Home health PT    Equipment Recommendations  None recommended by PT    Recommendations for Other Services       Precautions / Restrictions Precautions Precautions: Knee Precaution Booklet Issued: Yes (comment) Restrictions Weight Bearing Restrictions: Yes LLE Weight Bearing: Weight bearing as tolerated      Mobility  Bed Mobility Overal bed mobility: Needs Assistance Bed Mobility: Supine to Sit     Supine to sit: Min guard     General bed mobility comments: Pt demonstrates good LLE abduction and hip flexion strength. Pt with heavy use of bed rails and HOB elevated. Pt is slightly impulsive during supine to sit transfer.  Transfers Overall transfer level: Needs assistance Equipment used: Rolling [place] (2 wheeled) Transfers: Sit to/from Stand Sit to Stand: Min guard         General transfer comment: Pt demonstrates good weight acceptance to LLE during transfer. Good anterior weight shifting and good standing stability noted. Pt is somewhat impulsive during transfer and does not wait for therapist to attach foley or apply gait belt. Pt starts ambulating without therapist and hops over to the recliner with increased LLE pain and poor weight shifting.    Ambulation/Gait Ambulation/Gait assistance: Min guard Ambulation Distance (Feet): 3 Feet Assistive device: Rolling [place] (2 wheeled) Gait Pattern/deviations: Step-to pattern Gait velocity: Decreased Gait velocity interpretation: <1.8 ft/sec, indicative of risk for recurrent falls General Gait Details: Pt is impulsive with gait and does not wait for therapist to instruct her in proper sequencing. She demonstrates decreased weight shift and poor sequencing while hopping with rolling [place] from bed to recliner.   Stairs            Wheelchair Mobility    Modified Rankin (Stroke Patients Only)       Balance Overall balance assessment: Needs assistance   Sitting balance-Leahy Scale: Fair       Standing balance-Leahy Scale: Fair                               Pertinent Vitals/Pain Pain Assessment: 0-10 Pain Score: 1  Pain Location: L knee Pain Descriptors / Indicators: Sharp Pain Intervention(s): Monitored during session;Premedicated before session    Home Living Family/patient expects to be discharged to:: Private residence Living Arrangements: Children (Son) Available Help at Discharge: Family Type of Home: House Home Access: Stairs to enter Entrance Stairs-Rails: Left Entrance Stairs-Number of Steps: 3 Home Layout: One level Home Equipment: Environmental consultantWalker - 2 wheels;Toilet riser;Shower seat;Shower seat - built in ([facility])      Prior Function Level of Independence: Independent         Comments: Limited community ambulation distances     Hand Dominance   Dominant Hand: Right    Extremity/Trunk Assessment   Upper  Extremity Assessment: Overall WFL for tasks assessed           Lower Extremity Assessment: LLE deficits/detail   LLE Deficits / Details: Independent SLR and SAQ. Full DF/PF. Pt reports some mild deficits in LLE light touch sensation at this time. RLE strength WFL     Communication   Communication: No difficulties  Cognition  Arousal/Alertness: Awake/alert Behavior During Therapy: WFL for tasks assessed/performed Overall Cognitive Status: Within Functional Limits for tasks assessed                      General Comments      Exercises Total Joint Exercises Ankle Circles/Pumps: Strengthening;Both;10 reps;Supine Quad Sets: Strengthening;Both;10 reps;Supine Gluteal Sets: Strengthening;Both;10 reps;Supine Towel Squeeze: Strengthening;Both;10 reps;Supine Short Arc Quad: Strengthening;Left;10 reps;Supine Heel Slides: Strengthening;Left;10 reps;Supine Hip ABduction/ADduction: Strengthening;Left;10 reps;Supine Straight Leg Raises: Strengthening;Left;10 reps;Supine Goniometric ROM: 0-104 degrees AAROM, pain limited      Assessment/Plan    PT Assessment Patient needs continued PT services  PT Diagnosis Difficulty walking;Abnormality of gait;Generalized weakness;Acute pain   PT Problem List Decreased strength;Decreased range of motion;Decreased activity tolerance;Decreased balance;Decreased knowledge of use of DME;Pain;Obesity  PT Treatment Interventions DME instruction;Gait training;Stair training;Therapeutic activities;Therapeutic exercise;Balance training;Neuromuscular re-education;Patient/family education;Manual techniques   PT Goals (Current goals can be found in the Care Plan section) Acute Rehab PT Goals Patient Stated Goal: Pt would like to return to walking with less pain PT Goal Formulation: With patient Time For Goal Achievement: 12/05/15 Potential to Achieve Goals: Good    Frequency BID   Barriers to discharge Inaccessible home environment 3 stairs, pt should be able to navigate safely by discharge    Co-evaluation               End of Session Equipment Utilized During Treatment: Gait belt Activity Tolerance: Patient tolerated treatment well Patient left: in chair;with call bell/phone within reach;with SCD's reapplied (towel roll under heel, polar care in place, no alarm) Nurse  Communication: Mobility status (Pt agrees to call RN prior to transfer)         Time: 1530-1600 PT Time Calculation (min) (ACUTE ONLY): 30 min   Charges:   PT Evaluation $Initial PT Evaluation Tier I: 1 Procedure PT Treatments $Therapeutic Exercise: 8-22 mins   PT G Codes:       Paula Hammond PT, DPT   Paula Hammond 11/21/2015, 4:59 PM

## 2015-11-21 NOTE — Progress Notes (Signed)
Dr. Joice LoftsPoggi notified of no orders for antibiotics, CPM and clarification of medication orders. MD placed orders. Stated to place CPAP order for patient.

## 2015-11-21 NOTE — Anesthesia Preprocedure Evaluation (Addendum)
Anesthesia Evaluation  Patient identified by MRN, date of birth, ID band Patient awake    Reviewed: Allergy & Precautions, NPO status , Patient's Chart, lab work & pertinent test results, reviewed documented beta blocker date and time   History of Anesthesia Complications (+) PONV and history of anesthetic complications  Airway Mallampati: II  TM Distance: >3 FB     Dental  (+) Chipped   Pulmonary           Cardiovascular hypertension, Pt. on medications      Neuro/Psych Seizures -,  PSYCHIATRIC DISORDERS Anxiety Depression Bipolar Disorder    GI/Hepatic GERD  ,  Endo/Other    Renal/GU Renal InsufficiencyRenal disease     Musculoskeletal  (+) Arthritis ,   Abdominal   Peds  Hematology   Anesthesia Other Findings Obese,. Brain surgery. No seizures now. EKG ok.  Reproductive/Obstetrics                            Anesthesia Physical Anesthesia Plan  ASA: III  Anesthesia Plan: Spinal   Post-op Pain Management:    Induction:   Airway Management Planned:   Additional Equipment:   Intra-op Plan:   Post-operative Plan:   Informed Consent: I have reviewed the patients History and Physical, chart, labs and discussed the procedure including the risks, benefits and alternatives for the proposed anesthesia with the patient or authorized representative who has indicated his/her understanding and acceptance.     Plan Discussed with: CRNA  Anesthesia Plan Comments:         Anesthesia Quick Evaluation

## 2015-11-21 NOTE — H&P (Signed)
Paper H&P to be scanned into permanent record. H&P reviewed. No changes. 

## 2015-11-21 NOTE — Transfer of Care (Signed)
Immediate Anesthesia Transfer of Care Note  Patient: Paula Hammond  Procedure(s) Performed: Procedure(s): TOTAL KNEE ARTHROPLASTY (Left)  Patient Location: PACU  Anesthesia Type:Spinal  Level of Consciousness: awake, alert  and oriented  Airway & Oxygen Therapy: Patient Spontanous Breathing and Patient connected to face mask oxygen  Post-op Assessment: Report given to RN and Post -op Vital signs reviewed and stable  Post vital signs: Reviewed and stable  Last Vitals:  Filed Vitals:   11/21/15 0633  BP: 128/67  Pulse: 86  Temp: 37.2 C  Resp: 16    Complications: No apparent anesthesia complications

## 2015-11-21 NOTE — Op Note (Signed)
11/21/2015  10:05 AM  Patient:   Paula Hammond  Pre-Op Diagnosis:   Degenerative joint disease, left knee.  Post-Op Diagnosis:   Same.  Procedure:   Left TKA using all-cemented [employer] Vanguard system with a 65 mm PCR femur, a 75 mm tibial tray with a 12 mm E-poly insert, and a 34 x 8.5 mm all-poly 3-pegged domed patella.  Surgeon:   Maryagnes Amos, MD  Assistant:   Horris Latino, PA-C   Anesthesia:   Spinal  Findings:   As above  Complications:   None  EBL:   10 cc  Fluids:   1300 cc crystalloid  UOP:   100 cc  TT:   90 minutes at 300 mmHg  Drains:   None  Closure:   Staples  Implants:   As above  Brief Clinical Note:   The patient is a 59 year old female with a long history of progressively worsening left knee pain. The patient's symptoms have progressed despite medications, activity modification, injections, etc. The patient's history and examination were consistent with advanced degenerative joint disease of the right knee confirmed by plain radiographs. The patient presents at this time for a left total knee arthroplasty.  Procedure:   The patient was brought into the operating room. After adequate spinal anesthesia was obtained, the patient was lain in the supine position. A Foley catheter was placed by the nurse before the right lower extremity was prepped with ChloraPrep solution and draped sterilely. Preoperative antibiotics were administered. After verifying the proper laterality with a surgical timeout, the limb was exsanguinated with an Esmarch and the tourniquet inflated to 300 mmHg. A standard anterior approach to the knee was made through an approximately 7 inch incision. The incision was carried down through the subcutaneous tissues to expose superficial retinaculum. This was split the length of the incision and the medial flap elevated sufficiently to expose the medial retinaculum. The medial retinaculum was incised, leaving a 3-4 mm cuff of tissue on the  patella. This was extended distally along the medial border of the patellar tendon and proximally through the medial third of the quadriceps tendon. A subtotal fat pad excision was performed before the soft tissues were elevated off the anteromedial and anterolateral aspects of the proximal tibia to the level of the collateral ligaments. The anterior portions of the medial and lateral menisci were removed, as was the anterior cruciate ligament. With the knee flexed to 90, the external tibial guide was positioned and the appropriate proximal tibial cut made. This piece was taken to the back table where it was measured and found to be optimally replicated by a 75 mm component.  Attention was directed to the distal femur. The intramedullary canal was accessed through a 3/8" drill hole. The intramedullary guide was inserted and position in order to obtain a neutral flexion gap. The intercondylar block was positioned with care taken to avoid notching the anterior cortex of the femur. The appropriate cut was made. Next, the distal cutting block was placed at 6 of valgus alignment. Using the 9 mm slot, the distal cut was made. The distal femur was measured and found to be optimally replicated by the 65 mm component. The 65 mm 4-in-1 cutting block was positioned and first the posterior, then the posterior chamfer, the anterior chamfer, femoral and intercondylar cuts were made. At this point, the posterior portions medial and lateral menisci were removed. A trial reduction was performed using the appropriate femoral and tibial components with first the 10  mm and then the 12 mm insert. The 12 mm insert demonstrated excellent stability to varus and valgus stressing both in flexion and extension while permitting full extension. Patella tracking was assessed and found to be excellent. Therefore, the tibial guide position was marked on the proximal tibia. The patella thickness was measured and found to be 22 mm. Therefore,  the appropriate cut was made. The surfaces were measured and found to be optimally replicated by the 34 mm component. The three peg holes were drilled in place before the trial button was inserted. Patella tracking was assessed and found to be excellent, passing the "no thumb test". The lug holes were drilled into the distal femur before the trial component was removed, leaving only the tibial tray. The keel was then created using the appropriate tower, reamer, and punch.  The bony surfaces were prepared for cementing by irrigating thoroughly with bacitracin saline solution. A bone plug was fashioned from some of the bone that had been removed previously and used to plug the distal femoral canal. In addition, 20 cc of Exparel diluted out to 60 cc with normal saline and 30 cc of 0.5% Sensorcaine were injected into the postero-medial and postero-lateral aspects of the knee, the medial and lateral gutter regions, and the peri-incisional tissues to help with postoperative analgesia. Meanwhile, the cement was being mixed on the back table. When it was ready, the tibial tray was cemented in first. The excess cement was removed using Personal assistantreer elevators. Next, the femoral component was impacted into place. Again, the excess cement was removed using Personal assistantreer elevators. The 12 mm trial insert was positioned and the knee brought into extension while the cement hardened. Finally, the patella was cemented into place and secured using the patellar clamp. Again, the excess cement was removed using Personal assistantreer elevators. Once the cement had hardened, the knee was placed through a range of motion with the findings as described above. Therefore, the trial insert was removed and, after verifying that no cement had been retained posteriorly, the permanent insert was positioned and secured using the appropriate key locking mechanism. Again the knee was placed through a range of motion with the findings as described above.  The wound was  copiously irrigated with bacitracin saline solution. The jet lavage system before the quadriceps tendon and retinacular layer were reapproximated using #0 Vicryl interrupted sutures. The superficial retinacular layer was closed using 2-0 Vicryl interrupted sutures and several layers for the skin was closed using staples. A sterile honeycomb dressing was applied to the skin before the leg was wrapped with an Ace wrap to accommodate the polar pack. The patient was then awakened and returned to the recovery room in satisfactory condition after tolerating the procedure well.

## 2015-11-22 LAB — BASIC METABOLIC PANEL
Anion gap: 5 (ref 5–15)
BUN: 17 mg/dL (ref 6–20)
CALCIUM: 8.7 mg/dL — AB (ref 8.9–10.3)
CHLORIDE: 99 mmol/L — AB (ref 101–111)
CO2: 29 mmol/L (ref 22–32)
CREATININE: 0.84 mg/dL (ref 0.44–1.00)
GFR calc non Af Amer: >60 mL/min (ref >60)
Glucose, Bld: 141 mg/dL — ABNORMAL HIGH (ref 65–99)
Potassium: 4.3 mmol/L (ref 3.5–5.1)
SODIUM: 133 mmol/L — AB (ref 135–145)

## 2015-11-22 LAB — CBC WITH DIFFERENTIAL/PLATELET
BASOS PCT: 1 %
Basophils Absolute: 0 10*3/uL (ref 0–0.1)
EOS ABS: 0.4 10*3/uL (ref 0–0.7)
EOS PCT: 7 %
HCT: 32.3 % — ABNORMAL LOW (ref 35.0–47.0)
HEMOGLOBIN: 10.7 g/dL — AB (ref 12.0–16.0)
LYMPHS ABS: 1.2 10*3/uL (ref 1.0–3.6)
Lymphocytes Relative: 20 %
MCH: 31.7 pg (ref 26.0–34.0)
MCHC: 33.2 g/dL (ref 32.0–36.0)
MCV: 95.6 fL (ref 80.0–100.0)
MONOS PCT: 9 %
Monocytes Absolute: 0.5 10*3/uL (ref 0.2–0.9)
NEUTROS PCT: 63 %
Neutro Abs: 3.8 10*3/uL (ref 1.4–6.5)
PLATELETS: 275 10*3/uL (ref 150–440)
RBC: 3.38 MIL/uL — AB (ref 3.80–5.20)
RDW: 13 % (ref 11.5–14.5)
WBC: 5.9 10*3/uL (ref 3.6–11.0)

## 2015-11-22 NOTE — Anesthesia Postprocedure Evaluation (Signed)
Anesthesia Post Note  Patient: Paula Hammond  Procedure(s) Performed: Procedure(s) (LRB): TOTAL KNEE ARTHROPLASTY (Left)  Anesthesia Post Evaluation  Last Vitals:  Filed Vitals:   11/22/15 0707 11/22/15 1528  BP: 112/62 122/62  Pulse: 69 65  Temp: 36.8 C 36.9 C  Resp: 18 18    Last Pain:  Filed Vitals:   11/22/15 1528  PainSc: 0-No pain    LLE Motor Response: Purposeful movement LLE Sensation: Full sensation RLE Motor Response: Purposeful movement RLE Sensation: Full sensation      Aditya Nastasi,  Sheran FavaMark R

## 2015-11-22 NOTE — Care Management (Signed)
Attempted to assess patient.  Currently working with Pt.  Will attempt at a later time

## 2015-11-22 NOTE — Progress Notes (Signed)
Clinical Social Worker (CSW) received SNF consult. PT is recommending home health. RN Case Manager is aware of above. Please reconsult if future social work needs arise. CSW signing off.   Latunya Kissick Morgan, LCSWA [phone] 

## 2015-11-22 NOTE — Progress Notes (Signed)
Physical Therapy Treatment Patient Details Name: Paula Hammond MRN: [id number] DOB: 10/19/1956 Today's Date: 11/22/2015    History of Present Illness Pt underwent L TKR and is POD#0 at time of evaluation. No reported post-op complications.    PT Comments    Patient continues to demonstrate impulsive behavior with ambulation and mobility and was reminded several times of safety cues. Patient demonstrates improved tolerance for transfers and ambulation tolerance with no loss of balance noted during this session. Pt was quite painful with weight bearing initially, though this improved with further ambulation. Pt had 4WW brakes locked to provide increased stability during ambulation. Skilled PT services continue to be indicated to address her mobility deficits.  Follow Up Recommendations  Home health PT     Equipment Recommendations       Recommendations for Other Services       Precautions / Restrictions Precautions Precautions: Knee Precaution Booklet Issued: Yes (comment) Restrictions Weight Bearing Restrictions: Yes LLE Weight Bearing: Weight bearing as tolerated    Mobility  Bed Mobility               General bed mobility comments: Pt received in bedside chair.   Transfers Overall transfer level: Needs assistance Equipment used: Rolling Belay (2 wheeled) Transfers: Sit to/from Stand Sit to Stand: Supervision         General transfer comment: Pt is rather impulsive with transfer and minimally uses LLE during transfer initially secondary to pain. Patient is able to use UEs appropriately with good hand placement.   Ambulation/Gait Ambulation/Gait assistance: Min guard Ambulation Distance (Feet):  (30+90) Assistive device: 4-wheeled [place] Gait Pattern/deviations: Step-to pattern Gait velocity: Decreased   General Gait Details: Pt educated to use UEs with elbows extended to advance RLE, also required cuing to bring RLE forward with "softer landing" to  demonstrate increased stance time on LLE. She is initially quite painful and impulsive with ambulation and required cuing for safety from PT. After initial bout of ambulation, patient demonstrated decreasing pain and impulsivitiy, hence a second bout completed with improved stance time on LLE, though cuing for safety during turns.    Stairs            Wheelchair Mobility    Modified Rankin (Stroke Patients Only)       Balance Overall balance assessment: Needs assistance Sitting-balance support: No upper extremity supported Sitting balance-Leahy Scale: Good     Standing balance support: Bilateral upper extremity supported Standing balance-Leahy Scale: Fair Standing balance comment: Initially several minor loss of balance episodes secondary to pain advancing RLE, afterwards patient demonstrated fair balance and no additional balance deficits after cuing from PT.                     Cognition Arousal/Alertness: Awake/alert Behavior During Therapy: WFL for tasks assessed/performed;Impulsive Overall Cognitive Status: Within Functional Limits for tasks assessed                      Exercises Total Joint Exercises Heel Slides: AROM;Both;10 reps Straight Leg Raises: AROM;Both;10 reps Long Arc Quad: AROM;Both;10 reps Goniometric ROM: 0-104    General Comments General comments (skin integrity, edema, etc.): Bruising on LLE around site of incision.       Pertinent Vitals/Pain Pain Assessment:  (Initially quite painful with WBing, improved with continued ambulation and mobility. ) Pain Location: L knee  Pain Descriptors / Indicators: Aching;Sharp Pain Intervention(s): Limited activity within patient's tolerance;Monitored during session;Premedicated before session;Ice applied  Home Living                      Prior Function            PT Goals (current goals can now be found in the care plan section) Acute Rehab PT Goals Patient Stated Goal: Pt  would like to return to walking with less pain PT Goal Formulation: With patient Time For Goal Achievement: 12/05/15 Potential to Achieve Goals: Good Progress towards PT goals: Progressing toward goals    Frequency  BID    PT Plan Current plan remains appropriate    Co-evaluation             End of Session Equipment Utilized During Treatment: Gait belt Activity Tolerance: Patient tolerated treatment well Patient left: in chair;with call bell/phone within reach;with SCD's reapplied (Pt declined chair alarm)     Time: 1045-1110 PT Time Calculation (min) (ACUTE ONLY): 25 min  Charges:  $Gait Training: 8-22 mins $Therapeutic Exercise: 8-22 mins                    G Codes:      Kerin RansomPatrick A McNamara, PT, DPT    11/22/2015, 1:26 PM

## 2015-11-22 NOTE — Care Management (Signed)
POD1 TKA.  Patient lives at home with her 59 year old son who is autistic.  [facility] [place]. PT is recomdmending home health PT and RW.  Patient states that she has a rollator at home and does not wish to get a RW.  Patient states that she also has a shower seat.  Patient provided list of home health agencies.  [facility] selected.  Jason from [facility] notified of referral.  DME order for 3 in 1.  Will from [facility] notified.  RNCM following for discharge planning

## 2015-11-22 NOTE — Progress Notes (Signed)
Patient teaching giving lovenox administration.Patient demonstrates correct use with teach back.

## 2015-11-22 NOTE — Progress Notes (Addendum)
  Subjective: 1 Day Post-Op Procedure(s) (LRB): TOTAL KNEE ARTHROPLASTY (Left) Patient reports pain as mild, but can jump to a 8 out of 10 with movement. Patient is well, and has had no acute complaints or problems Plan is to go home with homehealth PT after hospital stay. Negative for chest pain and shortness of breath Fever: no Gastrointestinal:Negative for nausea and vomiting  Objective: Vital signs in last 24 hours: Temp:  [97.4 F (36.3 C)-98.9 F (37.2 C)] 98.3 F (36.8 C) (11/30 0707) Pulse Rate:  [58-85] 69 (11/30 0707) Resp:  [18-20] 18 (11/30 0707) BP: (100-152)/(54-91) 112/62 mmHg (11/30 0707) SpO2:  [98 %-100 %] 99 % (11/30 0707) FiO2 (%):  [21 %] 21 % (11/29 1125) Weight:  [130.727 kg (288 lb 3.2 oz)] 130.727 kg (288 lb 3.2 oz) (11/29 1133)  Intake/Output from previous day:  Intake/Output Summary (Last 24 hours) at 11/22/15 0734 Last data filed at 11/22/15 0500  Gross per 24 hour  Intake 4313.33 ml  Output   1385 ml  Net 2928.33 ml    Intake/Output this shift:    Labs:  Recent Labs  11/22/15 0538  HGB 10.7*    Recent Labs  11/22/15 0538  WBC 5.9  RBC 3.38*  HCT 32.3*  PLT 275    Recent Labs  11/22/15 0538  NA 133*  K 4.3  CL 99*  CO2 29  BUN 17  CREATININE 0.84  GLUCOSE 141*  CALCIUM 8.7*   No results for input(s): LABPT, INR in the last 72 hours.   EXAM General - Patient is Alert, Appropriate and Oriented Extremity - Neurologically intact ABD soft Sensation intact distally Intact pulses distally Dorsiflexion/Plantar flexion intact Incision: scant drainage No cellulitis present Dressing/Incision - blood tinged drainage Motor Function - intact, moving foot and toes well on exam.  Negative Homan's to bilateral lower extremities.  Abdomen soft on exam with normal BS.  No tympany.  Past Medical History  Diagnosis Date  . Hypertension   . Restless leg   . Hypercholesteremia   . PONV (postoperative nausea and vomiting) 1991     [place]  . Arthritis   . Bipolar disorder (HCC)   . Anxiety   . Depression   . GERD (gastroesophageal reflux disease)   . Seizures (HCC)   . Kidney stones     Assessment/Plan: 1 Day Post-Op Procedure(s) (LRB): TOTAL KNEE ARTHROPLASTY (Left) Active Problems:   Status post total left knee replacement using cement  Estimated body mass index is 47.96 kg/(m^2) as calculated from the following:   Height as of this encounter: 5\' 5"  (1.651 m).   Weight as of this encounter: 130.727 kg (288 lb 3.2 oz). Advance diet Up with therapy D/C IV fluids when tolerating PO intake  Pt has had a BM post surgery.  Foley removed this morning. Pt is doing very well, will continue to work with PT. Na 133, will d/c IVF. If she continues to improve, can potentially discharge tomorrow to home with homehealth services. Care management to assist with discharge.  DVT Prophylaxis - Lovenox, Foot Pumps and TED hose Weight-Bearing as tolerated to left leg  J. Horris LatinoLance McGhee, PA-C [facility] 11/22/2015, 7:34 AM

## 2015-11-22 NOTE — Progress Notes (Signed)
Physical Therapy Treatment Patient Details Name: Paula Hammond MRN: [id number] DOB: Jun 05, 1956 Today's Date: 11/22/2015    History of Present Illness Pt underwent L TKR and is POD#0 at time of evaluation. No reported post-op complications.    PT Comments    Pt up in chair since this morning and ready to go back to bed. Pt demonstrating good active range of motion in the left knee. Limits use functionally at this time despite encouragement. Demonstrating improved gait distance, but continues with a step to pattern despite cueing for reciprocal pattern. Pt impulsive with movement with painful reactions, which then subsides. Pt uses rollator in locked position and glides along floor to maintain safe control of rollator. Pt requires Min A to return to bed and use of trapeze. Pt has steps to enter the home and will need to perform next session. Continue PT to progress active range of motion, strength, safety, ambulation quality and steps for safe optimal return home.   Follow Up Recommendations  Home health PT (pt needs to perform stairs)     Equipment Recommendations  None recommended by PT    Recommendations for Other Services       Precautions / Restrictions Precautions Precautions: Knee Precaution Booklet Issued: Yes (comment) Restrictions Weight Bearing Restrictions: Yes LLE Weight Bearing: Weight bearing as tolerated    Mobility  Bed Mobility Overal bed mobility: Needs Assistance Bed Mobility: Sit to Supine       Sit to supine: Min assist (for LLE)   General bed mobility comments: Use of trapeze to reposition upward in bed  Transfers Overall transfer level: Needs assistance Equipment used: 4-wheeled [place] Transfers: Sit to/from Stand Sit to Stand: Supervision (takes pt 2 attempts to stand)         General transfer comment: Impulsive movements, 2 attempts to stand, as pt overwhelmed with pain with first attempt. No LOB  Ambulation/Gait Ambulation/Gait  assistance: Min guard Ambulation Distance (Feet): 250 Feet Assistive device: 4-wheeled [place] Gait Pattern/deviations: Step-to pattern;Antalgic;Wide base of support;Decreased stance time - left Gait velocity: Decreased Gait velocity interpretation: <1.8 ft/sec, indicative of risk for recurrent falls General Gait Details: Requires use of rollator with brakes on to maintain good control of rollator. Cues for small step on L and equal step on R, but pt noncompliant.    Stairs            Wheelchair Mobility    Modified Rankin (Stroke Patients Only)       Balance Overall balance assessment: Needs assistance Sitting-balance support: No upper extremity supported Sitting balance-Leahy Scale: Good     Standing balance support: Bilateral upper extremity supported Standing balance-Leahy Scale: Fair Standing balance comment: Initially several minor loss of balance episodes secondary to pain advancing RLE, afterwards patient demonstrated fair balance and no additional balance deficits after cuing from PT.                     Cognition Arousal/Alertness: Awake/alert Behavior During Therapy: WFL for tasks assessed/performed;Impulsive Overall Cognitive Status: Within Functional Limits for tasks assessed                      Exercises Total Joint Exercises Ankle Circles/Pumps: AROM;Both;20 reps (long sit) Quad Sets: Strengthening;Both;20 reps;Supine (with 2 towel rolls L ankle for gravity assisted stretch) Heel Slides: AROM;Left;10 reps;Seated (4 positions each rep for stretch) Straight Leg Raises: AROM;Both;10 reps Long Arc Quad: AROM;Both;10 reps Goniometric ROM: 0-98 degrees    General Comments General comments (  skin integrity, edema, etc.): Bruising on LLE around site of incision.       Pertinent Vitals/Pain Pain Assessment: 0-10 Pain Score: 2  Pain Location: L knee and sciatica pain L ITB area Pain Descriptors / Indicators: Jabbing;Sore;Sharp Pain  Intervention(s): Monitored during session;Premedicated before session;Repositioned;Ice applied    Home Living                      Prior Function            PT Goals (current goals can now be found in the care plan section) Acute Rehab PT Goals Patient Stated Goal: Pt would like to return to walking with less pain PT Goal Formulation: With patient Time For Goal Achievement: 12/05/15 Potential to Achieve Goals: Good Progress towards PT goals: Progressing toward goals    Frequency  BID    PT Plan Current plan remains appropriate    Co-evaluation             End of Session Equipment Utilized During Treatment: Gait belt Activity Tolerance: Patient tolerated treatment well Patient left: in bed;with call bell/phone within reach;with bed alarm set;with SCD's reapplied (polar care)     Time: [id number] PT Time Calculation (min) (ACUTE ONLY): 38 min  Charges:  $Gait Training: 8-22 mins $Therapeutic Exercise: 8-22 mins $Therapeutic Activity: 8-22 mins                    G Codes:      Kristeen MissHeidi Elizabeth Bishop 11/22/2015, 2:04 PM

## 2015-11-22 NOTE — Progress Notes (Signed)
Patient complaints of pain. Pain medication giving. Patient repositioned. Patient education giving to patient about rolls under heels, patient states "Dont care what there for I don't want them". Patient refuses to apply CPM . Polar care intact. Nursing interventions taking.

## 2015-11-22 NOTE — Progress Notes (Signed)
Patient is POD 1 LTKA. Pain controlled with PRN medication. Patient up to chair.. Patient resting between care. Patient LBM 11/21/2015 post surgery.

## 2015-11-23 ENCOUNTER — Encounter: Payer: Self-pay | Admitting: Surgery

## 2015-11-23 LAB — CBC
HEMATOCRIT: 32.3 % — AB (ref 35.0–47.0)
HEMOGLOBIN: 11 g/dL — AB (ref 12.0–16.0)
MCH: 32.2 pg (ref 26.0–34.0)
MCHC: 34.1 g/dL (ref 32.0–36.0)
MCV: 94.2 fL (ref 80.0–100.0)
Platelets: 277 10*3/uL (ref 150–440)
RBC: 3.42 MIL/uL — ABNORMAL LOW (ref 3.80–5.20)
RDW: 12.9 % (ref 11.5–14.5)
WBC: 7.5 10*3/uL (ref 3.6–11.0)

## 2015-11-23 LAB — BASIC METABOLIC PANEL
ANION GAP: 6 (ref 5–15)
BUN: 11 mg/dL (ref 6–20)
CO2: 27 mmol/L (ref 22–32)
Calcium: 8.7 mg/dL — ABNORMAL LOW (ref 8.9–10.3)
Chloride: 97 mmol/L — ABNORMAL LOW (ref 101–111)
Creatinine, Ser: 0.65 mg/dL (ref 0.44–1.00)
GFR calc Af Amer: >60 mL/min (ref >60)
Glucose, Bld: 122 mg/dL — ABNORMAL HIGH (ref 65–99)
POTASSIUM: 4.2 mmol/L (ref 3.5–5.1)
SODIUM: 130 mmol/L — AB (ref 135–145)

## 2015-11-23 MED ORDER — ENOXAPARIN SODIUM 40 MG/0.4ML ~~LOC~~ SOLN: 40.0000 mg | SUBCUTANEOUS | Status: DC

## 2015-11-23 MED ORDER — OXYCODONE HCL 5 MG PO TABS: 5.0000 mg | ORAL_TABLET | ORAL | Status: DC | PRN

## 2015-11-23 MED ORDER — TRAMADOL HCL 50 MG PO TABS: 50.0000 mg | ORAL_TABLET | Freq: Four times a day (QID) | ORAL | Status: DC | PRN

## 2015-11-23 MED ORDER — TRAMADOL HCL 50 MG PO TABS
50.0000 mg | ORAL_TABLET | Freq: Four times a day (QID) | ORAL | Status: DC | PRN
  Administered 2015-11-23 (×2): 100 mg via ORAL
  Filled 2015-11-23 (×2): qty 2

## 2015-11-23 NOTE — Progress Notes (Signed)
POD 2 LTK. Patient resting between care. No acute distress noted. Pain controlled with PRN medication. Patient refuses second attempt to apply CPM.

## 2015-11-23 NOTE — Progress Notes (Signed)
Physical Therapy Treatment Patient Details Name: Paula Hammond MRN: [id number] DOB: 1956/07/30 Today's Date: 11/23/2015    History of Present Illness Pt underwent L TKR and is POD#0 at time of evaluation. No reported post-op complications.    PT Comments    Pt notes she had increased pain earlier this morning; pain medicines given and pain is easing (5/10) currently. Pt has numerous questions regarding sodium levels and how to manage, swelling management and positioning of left lower extremity. Pt's questions answered to her satisfaction and revisited during MD visit during session. Pt continues to demonstrate safe ambulation with rollator in locked position; however, continues with step gait pattern at this time. Pt demonstrated up/down 4 steps with both handrails; required safety assist of 2 people only to gain confidence to take the first step (mind over matter). Once pt took the first step pt was able to continue with some effort, but much less fear. Plan to practice again this afternoon. Pt continues to demonstrate good range of motion in the left knee and encouraged to use left lower extremity more for functional activities like getting in/out of chair. Pt show less impulsivity and attempt to use left leg more. Continue treatment this afternoon; MD plans to discharge pt after PT session in the pm.   Follow Up Recommendations  Home health PT     Equipment Recommendations  None recommended by PT    Recommendations for Other Services       Precautions / Restrictions Precautions Precautions: Knee Restrictions Weight Bearing Restrictions: Yes LLE Weight Bearing: Weight bearing as tolerated    Mobility  Bed Mobility Overal bed mobility: Needs Assistance Bed Mobility: Supine to Sit       Sit to supine: Min assist (for LLE)   General bed mobility comments: Use of trapeze to reposition upright in bed  Transfers Overall transfer level: Modified independent Equipment used:  4-wheeled Rolon Transfers: Sit to/from Stand Sit to Stand: Modified independent (Device/Increase time) (encouraged to use LLE as much as possible)         General transfer comment: Demonstrates increased focus today on transfers with safe hand placement and attempted increased use of LLE. Much less impulsive  Ambulation/Gait Ambulation/Gait assistance: Supervision Ambulation Distance (Feet): 150 Feet (also to/from bathroom 20 ft each way) Assistive device: 4-wheeled Ficek (glides with brakes locked) Gait Pattern/deviations: Step-to pattern;Decreased stance time - left;Decreased step length - right;Trunk flexed Gait velocity: Decreased Gait velocity interpretation: Below normal speed for age/gender General Gait Details: Requires use of rollator with brakes on to maintain good control of rollator. Cues for small step on L and equal step on R, but pt noncompliant.    Stairs Stairs: Yes Stairs assistance: +2 safety/equipment Stair Management: Two rails;Step to pattern Number of Stairs: 4 General stair comments: Takes increased time/instruction for pt to gain confidence to try. 2nd person assist for safety increased pts confidence and pt able to perform with effort, but safely.   Wheelchair Mobility    Modified Rankin (Stroke Patients Only)       Balance           Standing balance support: Bilateral upper extremity supported Standing balance-Leahy Scale: Fair                      Cognition Arousal/Alertness: Awake/alert Behavior During Therapy: WFL for tasks assessed/performed;Impulsive Overall Cognitive Status: Within Functional Limits for tasks assessed  Exercises Total Joint Exercises Ankle Circles/Pumps: AROM;Both;Other reps (comment) (30 reps, long sit) Quad Sets: Strengthening;Both;20 reps;Supine (with gravity assisted L knee extension for stretch) Heel Slides: AROM;Left;10 reps;Seated (4 positions each rep for stretch) Long  Arc Quad: AROM;Left;20 reps;Seated Goniometric ROM: 0-100 degrees    General Comments        Pertinent Vitals/Pain Pain Assessment: 0-10 Pain Score: 5  Pain Location: L knee Pain Descriptors / Indicators: Sore (grabbing with certain movements) Pain Intervention(s): Monitored during session;Premedicated before session;Repositioned;Ice applied    Home Living                      Prior Function            PT Goals (current goals can now be found in the care plan section) Progress towards PT goals: Progressing toward goals    Frequency  BID    PT Plan Current plan remains appropriate    Co-evaluation             End of Session Equipment Utilized During Treatment: Gait belt Activity Tolerance: Patient tolerated treatment well Patient left: in bed;with call bell/phone within reach;with SCD's reapplied;Other (comment) (polar care in place; pt refused alarm)     Time: [id number] PT Time Calculation (min) (ACUTE ONLY): 57 min  Charges:  $Gait Training: 23-37 mins $Therapeutic Exercise: 8-22 mins $Therapeutic Activity: 8-22 mins                    G Codes:      Kristeen MissHeidi Elizabeth Bishop 11/23/2015, 11:01 AM

## 2015-11-23 NOTE — Progress Notes (Signed)
Physical Therapy Treatment Patient Details Name: Paula Hammond MRN: [id number] DOB: 04/02/1956 Today's Date: 11/23/2015    History of Present Illness Pt underwent L TKR and is POD#0 at time of evaluation. No reported post-op complications.    PT Comments    Pt demonstrated much improved stair climbing this afternoon without hesitation and only requiring Min guard assist of 1. Pt asks questions regarding repetitions, frequency and duration of exercises/stretches. Questions answered to pt's satisfaction and pt feels she understands well. Pt to be discharged home today with either home health or outpatient PT.   Follow Up Recommendations  Home health PT     Equipment Recommendations  None recommended by PT    Recommendations for Other Services       Precautions / Restrictions Precautions Precautions: Knee Restrictions Weight Bearing Restrictions: Yes LLE Weight Bearing: Weight bearing as tolerated    Mobility  Bed Mobility Overal bed mobility: Needs Assistance Bed Mobility: Supine to Sit     Supine to sit: Min guard Sit to supine: Min assist (for LLE)   General bed mobility comments: Use of trapeze to reposition upright in bed  Transfers Overall transfer level: Modified independent Equipment used: 4-wheeled [place] Transfers: Sit to/from Stand Sit to Stand: Modified independent (Device/Increase time)         General transfer comment: Demonstrates increased focus today on transfers with safe hand placement and attempted increased use of LLE. Much less impulsive  Ambulation/Gait Ambulation/Gait assistance: Supervision Ambulation Distance (Feet): 150 Feet Assistive device: 4-wheeled Chaudhuri Gait Pattern/deviations: Step-to pattern Gait velocity: Decreased Gait velocity interpretation: Below normal speed for age/gender General Gait Details: cues for equal steps; pt notes increased pain with taking too large of a step on the left   Stairs Stairs: Yes Stairs  assistance: Min guard Stair Management: Two rails;Step to pattern Number of Stairs: 4 General stair comments: Much improved ability to perform steps this afternoon with good technique and only Min guard of 1.  Wheelchair Mobility    Modified Rankin (Stroke Patients Only)       Balance Overall balance assessment: Needs assistance         Standing balance support: Bilateral upper extremity supported Standing balance-Leahy Scale: Fair                      Cognition Arousal/Alertness: Awake/alert Behavior During Therapy: WFL for tasks assessed/performed;Impulsive Overall Cognitive Status: Within Functional Limits for tasks assessed                      Exercises Total Joint Exercises Ankle Circles/Pumps: AROM;Both;Other reps (comment) (30 reps, long sit) Quad Sets: Strengthening;Both;20 reps;Supine (with gravity assisted L knee extension for stretch) Heel Slides: AROM;Left;10 reps;Seated (4 positions each rep for stretch) Long Arc Quad: AROM;Left;20 reps;Seated Goniometric ROM: 0-100 degrees Other Exercises Other Exercises: Pt with questions regarding repetitions, frequency and duration of exercises and stretching and appropriate level of activity at home. Pt feels she understands well.    General Comments        Pertinent Vitals/Pain Pain Assessment: 0-10 Pain Score: 1  Pain Location: L knee Pain Descriptors / Indicators: Constant;Sore Pain Intervention(s): Premedicated before session;Repositioned    Home Living                      Prior Function            PT Goals (current goals can now be found in the care plan section)  Progress towards PT goals: Goals met/education completed, patient discharged from PT    Frequency  BID    PT Plan Current plan remains appropriate    Co-evaluation             End of Session Equipment Utilized During Treatment: Gait belt Activity Tolerance: Patient tolerated treatment well Patient  left: in chair;with call bell/phone within reach;with chair alarm set (did not replace ice, as pt wishes nsg asst in to dress)     Time: 5366-4403 PT Time Calculation (min) (ACUTE ONLY): 14 min  Charges:  $Gait Training: 8-22 mins $Therapeutic Exercise: 8-22 mins $Therapeutic Activity: 8-22 mins                    G Codes:      Charlaine Dalton 11/23/2015, 2:05 PM

## 2015-11-23 NOTE — Care Management Important Message (Signed)
Important Message  Patient Details  Name: Paula Hammond MRN: [id number] Date of Birth: 08/19/1956   Medicare Important Message Given:  Yes    Olegario MessierKathy A Lamin Chandley 11/23/2015, 10:32 AM

## 2015-11-23 NOTE — Discharge Summary (Signed)
Physician Discharge Summary  Patient ID: Paula Hammond MRN: [id number] DOB/AGE: 59/21/1957 59 y.o.  Admit date: 11/21/2015 Discharge date: 11/23/2015  Admission Diagnoses:  PRIMARY OSTEOARTHRITIS Degenerative joint disease, left knee.  Discharge Diagnoses: Patient Active Problem List   Diagnosis Date Noted  . Status post total left knee replacement using cement 11/21/2015  Degenerative joint disease, left knee.  Past Medical History  Diagnosis Date  . Hypertension   . Restless leg   . Hypercholesteremia   . PONV (postoperative nausea and vomiting) 1991    brain surgery done in [place]  . Arthritis   . Bipolar disorder (HCC)   . Anxiety   . Depression   . GERD (gastroesophageal reflux disease)   . Seizures (HCC)   . Kidney stones      Transfusion: None   Consultants (if any):    Discharged Condition: Improved  Hospital Course: Paula Hammond is an 59 y.o. female who was admitted 11/21/2015 with a diagnosis of Degenerative joint disease of the left knee and went to the operating room on 11/21/2015 and underwent the above named procedures.    Surgeries: Procedure(s): TOTAL KNEE ARTHROPLASTY on 11/21/2015 Patient tolerated the surgery well. Taken to PACU where she was stabilized and then transferred to the orthopedic floor.  Started on Lovenox  q 12 hrs. Foot pumps applied bilaterally at 80 mm. Heels elevated on bed with rolled towels. No evidence of DVT. Negative Homan. Physical therapy started on day #1 for gait training and transfer. OT started day #1 for ADL and assisted devices.  Patient's IV and Foley were d/c on POD1  Implants: Left TKA using all-cemented Biomet Vanguard system with a 65 mm PCR femur, a 75 mm tibial tray with a 12 mm E-poly insert, and a 34 x 8.5 mm all-poly 3-pegged domed patella.  She was given perioperative antibiotics:  Anti-infectives    Start     Dose/Rate Route Frequency Ordered Stop   11/21/15 1800  vancomycin (VANCOCIN)  IVPB 1000 mg/200 mL premix     1,000 mg 200 mL/hr over 60 Minutes Intravenous Every 12 hours 11/21/15 1322 11/22/15 0624   11/21/15 0730  vancomycin (VANCOCIN) 2,000 mg in sodium chloride 0.9 % 500 mL IVPB  Status:  Discontinued     2,000 mg 250 mL/hr over 120 Minutes Intravenous  Once 11/21/15 0640 11/21/15 1122   11/21/15 0648  vancomycin (VANCOCIN) 1 GM/200ML IVPB    Comments:  Carma Lair: cabinet override      11/21/15 0648 11/21/15 0820    .  She was given sequential compression devices, early ambulation, and lovenox for DVT prophylaxis.  She benefited maximally from the hospital stay and there were no complications.    Recent vital signs:  Filed Vitals:   11/23/15 0320 11/23/15 0742  BP: 100/81 116/51  Pulse: 69 70  Temp: 98.1 F (36.7 C) 97.9 F (36.6 C)  Resp: 16 18    Recent laboratory studies:  Lab Results  Component Value Date   HGB 11.0* 11/23/2015   HGB 10.7* 11/22/2015   HGB 12.0 11/01/2015   Lab Results  Component Value Date   WBC 7.5 11/23/2015   PLT 277 11/23/2015   Lab Results  Component Value Date   INR 1.02 11/01/2015   Lab Results  Component Value Date   NA 130* 11/23/2015   K 4.2 11/23/2015   CL 97* 11/23/2015   CO2 27 11/23/2015   BUN 11 11/23/2015   CREATININE 0.65 11/23/2015  GLUCOSE 122* 11/23/2015    Discharge Medications:     Medication List    TAKE these medications        amitriptyline 10 MG tablet  Commonly known as:  ELAVIL  Take 25 mg by mouth at bedtime.     aspirin 81 MG tablet  Take 81 mg by mouth 2 (two) times daily.     CALCIUM 600 + D PO  Take 1 tablet by mouth 2 (two) times daily.     enoxaparin 40 MG/0.4ML injection  Commonly known as:  LOVENOX  Inject 0.4 mLs (40 mg total) into the skin daily.     Ferrous Sulfate 27 MG Tabs  Take 1 tablet by mouth 2 (two) times daily.     Fish Oil 1000 MG Caps  Take 1 capsule by mouth every morning.     gabapentin 100 MG capsule  Commonly known as:   NEURONTIN  Take by mouth 3 (three) times daily.     gabapentin 300 MG capsule  Commonly known as:  NEURONTIN  Take 300 mg by mouth 3 (three) times daily. 2 capsules three times a day.  Use titration schedule.     lisinopril-hydrochlorothiazide 10-12.5 MG tablet  Commonly known as:  PRINZIDE,ZESTORETIC  Take 1 tablet by mouth daily.     lisinopril-hydrochlorothiazide 20-12.5 MG tablet  Commonly known as:  PRINZIDE,ZESTORETIC  Take 1 tablet by mouth daily. In am     mupirocin ointment 2 %  Commonly known as:  BACTROBAN  Apply 1 application topically 2 (two) times daily.     omeprazole 20 MG capsule  Commonly known as:  PRILOSEC  Take 20 mg by mouth every other day.     oxyCODONE 5 MG immediate release tablet  Commonly known as:  Oxy IR/ROXICODONE  Take 1-2 tablets (5-10 mg total) by mouth every 4 (four) hours as needed for breakthrough pain.     pravastatin 10 MG tablet  Commonly known as:  PRAVACHOL  Take 10 mg by mouth every evening.     predniSONE 50 MG tablet  Commonly known as:  DELTASONE  Take 1 tablet (50 mg total) by mouth daily.     propranolol 10 MG tablet  Commonly known as:  INDERAL  Take 60 mg by mouth every morning.     RISPERDAL 0.5 MG tablet  Generic drug:  risperiDONE  Take 0.5 mg by mouth at bedtime.     rOPINIRole 3 MG tablet  Commonly known as:  REQUIP  Take 3 mg by mouth at bedtime.     traMADol 50 MG tablet  Commonly known as:  ULTRAM  Take 1-2 tablets (50-100 mg total) by mouth every 6 (six) hours as needed for severe pain.     venlafaxine 100 MG tablet  Commonly known as:  EFFEXOR  Take 100 mg by mouth 2 (two) times daily.     venlafaxine XR 150 MG 24 hr capsule  Commonly known as:  EFFEXOR-XR  Take 150 mg by mouth daily with breakfast.        Diagnostic Studies: Dg Knee Left Port  11/21/2015  CLINICAL DATA:  Status post left total knee replacement using cement. EXAM: PORTABLE LEFT KNEE - 1-2 VIEW COMPARISON:  October 09, 2009.  FINDINGS: The femoral and tibial components appear to be well situated. Expected postoperative findings are noted in the anterior soft tissues. No fracture or dislocation is noted. IMPRESSION: Status post left total knee arthroplasty. Electronically Signed   By: Roque Lias  Montez HagemanJr, M.D.   On: 11/21/2015 10:53    Disposition: Pt is stable and ready for discharge.  Pt benefited greatly from her hospital stay.  Her foley and IVF were d/c on POD1, pt has had multiple bowel movements.  Pt has cleared stairs with PT.  Pt will continue lovenox 40mg  daily for DVT prophylaxis and will take oxycodone and tramadol as needed for pain at home.  Pt is insisting on outpatient therapy, will set that up with [place] [facility].  Pt was instructed not drive while on narcotic pain medicine at home.  She will follow-up in 10-14 days for staple removal.      Follow-up Information    Follow up with Meriel PicaJames L Smith Mcnicholas, PA-C.   Specialty:  Physician Assistant   Why:  For staple removal, For wound re-check   Contact information:   [address] [facility] [place] [id number] [phone]      Signed: Meriel PicaJames L Madison Albea PA-C 11/23/2015, 10:56 AM

## 2015-11-23 NOTE — Discharge Instructions (Signed)

## 2015-11-23 NOTE — Care Management Note (Addendum)
Case Management Note  Patient Details  Name: Paula Hammond MRN: [id number] Date of Birth: 10-01-1956  Subjective/Objective:                  Met with patient to discuss Lovenox injections and DME. Patient states that "she is very upset and does not want anything else to do with this hospital including [place]". She admits that "she got off the toilet twice yesterday without assistance but states it was due to fear of developing sciatica for sitting too long and no one answered her call". She also refused a rolling Levan and insists on continuing to use her rollator although PT is recommending a front-wheeled Carbone. Sharain RN Therapist, sports is aware per patient of this complaint.  Action/Plan: I have cancelled home health with [facility]. I have notified [facility] per patient request and faxed orders for a [facility] on Sunday Dec. 4. (Fax [phone], phone [phone]). RNCM will continue to follow. [place]oven[place] 29m [place]14 [phone] price.  [facility]- [place]". Patient informed and insists on [place] I strongly advised her not to drive with narcotics. She states "she drives all the time with medications that make her drowsy". I have sent message to [place] to update.       Expected Discharge Date:                  [place] Discharge Plan:  [place] Referral:     Discharge planning Services  CM Consult  Post Acute Care Choice:  Durable Medical Equipment Choice offered to:     DME Arranged:  3-N-1 [facility] Agency[facility]  [place]  [facility]     Status of Service:  In process, will continue to follow  Medicare Important Message Given:    Date Medicare IM Given:    Medicare IM give by:    Date Additional Medicare IM Given:    Additional Medicare  Important Message give by:     If discussed at [place] Meetings, dates discussed:    Additional Comments:  AMarshell Garfinkel RN 11/23/2015, 9:36 AM

## 2015-11-23 NOTE — Progress Notes (Signed)
  Subjective: 2 Days Post-Op Procedure(s) (LRB): TOTAL KNEE ARTHROPLASTY (Left) Patient reports pain as severe.   Patient is well, but has had some minor complaints of pain Plan is to go home with homehealth PT after hospital stay. Negative for chest pain and shortness of breath Fever: no Gastrointestinal:Negative for nausea and vomiting  Objective: Vital signs in last 24 hours: Temp:  [97.9 F (36.6 C)-98.7 F (37.1 C)] 97.9 F (36.6 C) (12/01 0742) Pulse Rate:  [65-70] 70 (12/01 0742) Resp:  [16-18] 18 (12/01 0742) BP: (100-122)/(51-92) 116/51 mmHg (12/01 0742) SpO2:  [97 %-100 %] 97 % (12/01 0742)  Intake/Output from previous day:  Intake/Output Summary (Last 24 hours) at 11/23/15 1026 Last data filed at 11/23/15 0745  Gross per 24 hour  Intake   1048 ml  Output   1200 ml  Net   -152 ml    Intake/Output this shift: Total I/O In: 240 [P.O.:240] Out: -   Labs:  Recent Labs  11/22/15 0538 11/23/15 0521  HGB 10.7* 11.0*    Recent Labs  11/22/15 0538 11/23/15 0521  WBC 5.9 7.5  RBC 3.38* 3.42*  HCT 32.3* 32.3*  PLT 275 277    Recent Labs  11/22/15 0538 11/23/15 0521  NA 133* 130*  K 4.3 4.2  CL 99* 97*  CO2 29 27  BUN 17 11  CREATININE 0.84 0.65  GLUCOSE 141* 122*  CALCIUM 8.7* 8.7*   No results for input(s): LABPT, INR in the last 72 hours.   EXAM General - Patient is Alert, Appropriate and Oriented Extremity - ABD soft Sensation intact distally Intact pulses distally Dorsiflexion/Plantar flexion intact Incision: scant drainage Dressing/Incision - blood tinged drainage Motor Function - intact, moving foot and toes well on exam.   Abdomen is soft without tympany. Dressing changed this AM, no signs of infection. Homan's negative to bilateral lower extremities.  Past Medical History  Diagnosis Date  . Hypertension   . Restless leg   . Hypercholesteremia   . PONV (postoperative nausea and vomiting) 1991    [place]  . Arthritis   . Bipolar disorder (HCC)   . Anxiety   . Depression   . GERD (gastroesophageal reflux disease)   . Seizures (HCC)   . Kidney stones     Assessment/Plan: 2 Days Post-Op Procedure(s) (LRB): TOTAL KNEE ARTHROPLASTY (Left) Active Problems:   Status post total left knee replacement using cement  Estimated body mass index is 47.96 kg/(m^2) as calculated from the following:   Height as of this encounter: 5\' 5"  (1.651 m).   Weight as of this encounter: 130.727 kg (288 lb 3.2 oz). Advance diet Up with therapy   Pt reports more pain today in her left knee, tramadol added for pain control. Pt continuing to do well with PT, needs to clear stairs. Na 130 this AM, placed patient on fluid restricted diet. Plan on d/c home tomorrow.  Pt does not want advanced home care, insisting on driving to outpatient therapy.  DVT Prophylaxis - Lovenox, Foot Pumps and TED hose Weight-Bearing as tolerated to left leg  J. Horris LatinoLance McGhee, PA-C [facility] 11/23/2015, 10:26 AM

## 2016-04-08 ENCOUNTER — Other Ambulatory Visit: Payer: Self-pay | Admitting: Preventative Medicine

## 2016-04-08 DIAGNOSIS — Z1239 Encounter for other screening for malignant neoplasm of breast: Secondary | ICD-10-CM

## 2016-04-15 ENCOUNTER — Other Ambulatory Visit: Payer: Self-pay | Admitting: Preventative Medicine

## 2016-04-15 DIAGNOSIS — Z1231 Encounter for screening mammogram for malignant neoplasm of breast: Secondary | ICD-10-CM

## 2016-04-17 ENCOUNTER — Ambulatory Visit: Payer: Medicare Other

## 2016-04-18 ENCOUNTER — Ambulatory Visit
Admission: RE | Admit: 2016-04-18 | Discharge: 2016-04-18 | Disposition: A | Discharge status: Home / Self Care | Payer: Medicare Other | Source: Ambulatory Visit | Attending: Preventative Medicine | Admitting: Preventative Medicine

## 2016-04-18 DIAGNOSIS — R922 Inconclusive mammogram: Secondary | ICD-10-CM | POA: Diagnosis not present

## 2016-04-18 DIAGNOSIS — Z1231 Encounter for screening mammogram for malignant neoplasm of breast: Secondary | ICD-10-CM

## 2016-04-22 ENCOUNTER — Other Ambulatory Visit: Payer: Self-pay | Admitting: Preventative Medicine

## 2016-04-22 DIAGNOSIS — R928 Other abnormal and inconclusive findings on diagnostic imaging of breast: Secondary | ICD-10-CM

## 2016-05-21 ENCOUNTER — Ambulatory Visit
Admission: RE | Admit: 2016-05-21 | Discharge: 2016-05-21 | Disposition: A | Discharge status: Home / Self Care | Payer: Medicare Other | Source: Ambulatory Visit | Attending: Preventative Medicine | Admitting: Preventative Medicine

## 2016-05-21 DIAGNOSIS — N6489 Other specified disorders of breast: Secondary | ICD-10-CM | POA: Insufficient documentation

## 2016-05-21 DIAGNOSIS — R928 Other abnormal and inconclusive findings on diagnostic imaging of breast: Secondary | ICD-10-CM

## 2016-09-08 ENCOUNTER — Emergency Department: Payer: Medicare Other

## 2016-09-08 ENCOUNTER — Emergency Department
Admission: EM | Admit: 2016-09-08 | Discharge: 2016-09-08 | Disposition: A | Discharge status: Home / Self Care | Payer: Medicare Other | Attending: Emergency Medicine | Admitting: Emergency Medicine

## 2016-09-08 DIAGNOSIS — S99922A Unspecified injury of left foot, initial encounter: Secondary | ICD-10-CM | POA: Diagnosis present

## 2016-09-08 DIAGNOSIS — S92492B Other fracture of left great toe, initial encounter for open fracture: Secondary | ICD-10-CM

## 2016-09-08 DIAGNOSIS — Y92018 Other place in single-family (private) house as the place of occurrence of the external cause: Secondary | ICD-10-CM | POA: Diagnosis not present

## 2016-09-08 DIAGNOSIS — Z7982 Long term (current) use of aspirin: Secondary | ICD-10-CM | POA: Insufficient documentation

## 2016-09-08 DIAGNOSIS — S91112A Laceration without foreign body of left great toe without damage to nail, initial encounter: Secondary | ICD-10-CM | POA: Insufficient documentation

## 2016-09-08 DIAGNOSIS — Y999 Unspecified external cause status: Secondary | ICD-10-CM | POA: Diagnosis not present

## 2016-09-08 DIAGNOSIS — I10 Essential (primary) hypertension: Secondary | ICD-10-CM | POA: Diagnosis not present

## 2016-09-08 DIAGNOSIS — Z79899 Other long term (current) drug therapy: Secondary | ICD-10-CM | POA: Insufficient documentation

## 2016-09-08 DIAGNOSIS — S91119A Laceration without foreign body of unspecified toe without damage to nail, initial encounter: Secondary | ICD-10-CM

## 2016-09-08 DIAGNOSIS — Y9301 Activity, walking, marching and hiking: Secondary | ICD-10-CM | POA: Insufficient documentation

## 2016-09-08 DIAGNOSIS — X58XXXA Exposure to other specified factors, initial encounter: Secondary | ICD-10-CM | POA: Diagnosis not present

## 2016-09-08 DIAGNOSIS — S92492A Other fracture of left great toe, initial encounter for closed fracture: Secondary | ICD-10-CM | POA: Diagnosis not present

## 2016-09-08 MED ORDER — LIDOCAINE-EPINEPHRINE (PF) 1 %-1:200000 IJ SOLN
30.0000 mL | Freq: Once | INTRAMUSCULAR | Status: AC
  Administered 2016-09-08: 30 mL via INTRADERMAL
  Filled 2016-09-08: qty 30

## 2016-09-08 MED ORDER — TETANUS-DIPHTH-ACELL PERTUSSIS 5-2.5-18.5 LF-MCG/0.5 IM SUSP
0.5000 mL | Freq: Once | INTRAMUSCULAR | Status: AC
  Administered 2016-09-08: 0.5 mL via INTRAMUSCULAR
  Filled 2016-09-08: qty 0.5

## 2016-09-08 MED ORDER — AMOXICILLIN-POT CLAVULANATE 875-125 MG PO TABS: 1.0000 | ORAL_TABLET | Freq: Two times a day (BID) | ORAL | 0 refills | Status: DC

## 2016-09-08 MED ORDER — NAPROXEN 500 MG PO TABS: 500.0000 mg | ORAL_TABLET | Freq: Two times a day (BID) | ORAL | 0 refills | Status: DC

## 2016-09-08 NOTE — ED Provider Notes (Signed)
[facility] Emergency Department Provider Note  ____________________________________________  Time seen: Approximately 9:55 AM  I have reviewed the triage vital signs and the nursing notes.   HISTORY  Chief Complaint Toe Injury    HPI Paula Hammond is a 60 y.o. female complains of left great toe pain. She was walking in her house this morning when she lost her balance going over a step and cause the left great toe to get hyperflexed. She had near instantaneous pain and bleeding, worse with movement or weightbearing. No other injuries. Did not fall or sustain any secondary injuries.     Past Medical History:  Diagnosis Date  . Anxiety   . Arthritis   . Bipolar disorder (HCC)   . Depression   . GERD (gastroesophageal reflux disease)   . Hypercholesteremia   . Hypertension   . Kidney stones   . PONV (postoperative nausea and vomiting) 1991   brain surgery done in [place]  . Restless leg   . Seizures [facility])      Patient Active Problem List   Diagnosis Date Noted  . Status post total left knee replacement using cement 11/21/2015     Past Surgical History:  Procedure Laterality Date  . BRAIN SURGERY  1991   for seizures  . CESAREAN SECTION     x 2  . CHOLECYSTECTOMY    . MIDDLE EAR SURGERY Left 1965   lanced left ear.  . TONSILLECTOMY    . TOTAL KNEE ARTHROPLASTY Left 11/21/2015   Procedure: TOTAL KNEE ARTHROPLASTY;  Surgeon: Christena Flake, MD;  Location: [facility] ORS;  Service: Orthopedics;  Laterality: Left;     Prior to Admission medications   Medication Sig Start Date End Date Taking? Authorizing Provider  amitriptyline (ELAVIL) 10 MG tablet Take 25 mg by mouth at bedtime.     Historical Provider, MD  amoxicillin-clavulanate (AUGMENTIN) 875-125 MG tablet Take 1 tablet by mouth 2 (two) times daily. 09/08/16   Sharman Cheek, MD  aspirin 81 MG tablet Take 81 mg by mouth 2 (two) times daily.     Historical Provider, MD  Calcium  Carb-Cholecalciferol (CALCIUM 600 + D PO) Take 1 tablet by mouth 2 (two) times daily.    Historical Provider, MD  enoxaparin (LOVENOX) 40 MG/0.4ML injection Inject 0.4 mLs (40 mg total) into the skin daily. 11/23/15   Anson Oregon, PA-C  Ferrous Sulfate 27 MG TABS Take 1 tablet by mouth 2 (two) times daily.    Historical Provider, MD  gabapentin (NEURONTIN) 100 MG capsule Take by mouth 3 (three) times daily.     Historical Provider, MD  gabapentin (NEURONTIN) 300 MG capsule Take 300 mg by mouth 3 (three) times daily. 2 capsules three times a day.  Use titration schedule.    Historical Provider, MD  lisinopril-hydrochlorothiazide (PRINZIDE,ZESTORETIC) 10-12.5 MG per tablet Take 1 tablet by mouth daily.    Historical Provider, MD  lisinopril-hydrochlorothiazide (PRINZIDE,ZESTORETIC) 20-12.5 MG tablet Take 1 tablet by mouth daily. In am    Historical Provider, MD  mupirocin ointment (BACTROBAN) 2 % Apply 1 application topically 2 (two) times daily. Patient not taking: Reported on 11/01/2015 07/17/15   Eustace Moore, MD  naproxen (NAPROSYN) 500 MG tablet Take 1 tablet (500 mg total) by mouth 2 (two) times daily with a meal. 09/08/16   Sharman Cheek, MD  Omega-3 Fatty Acids (FISH OIL) 1000 MG CAPS Take 1 capsule by mouth every morning.    Historical Provider, MD  omeprazole (  PRILOSEC) 20 MG capsule Take 20 mg by mouth every other day.    Historical Provider, MD  oxyCODONE (OXY IR/ROXICODONE) 5 MG immediate release tablet Take 1-2 tablets (5-10 mg total) by mouth every 4 (four) hours as needed for breakthrough pain. 11/23/15   Anson Oregon, PA-C  pravastatin (PRAVACHOL) 10 MG tablet Take 10 mg by mouth every evening.    Historical Provider, MD  predniSONE (DELTASONE) 50 MG tablet Take 1 tablet (50 mg total) by mouth daily. Patient not taking: Reported on 11/01/2015 07/17/15   Eustace Moore, MD  propranolol (INDERAL) 10 MG tablet Take 60 mg by mouth every morning.     Historical Provider, MD   risperiDONE (RISPERDAL) 0.5 MG tablet Take 0.5 mg by mouth at bedtime.    Historical Provider, MD  rOPINIRole (REQUIP) 3 MG tablet Take 3 mg by mouth at bedtime.    Historical Provider, MD  traMADol (ULTRAM) 50 MG tablet Take 1-2 tablets (50-100 mg total) by mouth every 6 (six) hours as needed for severe pain. 11/23/15   Anson Oregon, PA-C  venlafaxine (EFFEXOR) 100 MG tablet Take 100 mg by mouth 2 (two) times daily.    Historical Provider, MD  venlafaxine XR (EFFEXOR-XR) 150 MG 24 hr capsule Take 150 mg by mouth daily with breakfast.    Historical Provider, MD     Allergies Mellaril [thioridazine]   No family history on file.  Social History Social History  Substance Use Topics  . Smoking status: Never Smoker  . Smokeless tobacco: Never Used  . Alcohol use No    Review of Systems  Constitutional:   No fever or chills.   Gastrointestinal:   Negative for abdominal pain, No vomiting.   Musculoskeletal:   Left great toe pain Neurological:   No numbness or tingling 10-point ROS otherwise negative.  ____________________________________________   PHYSICAL EXAM:  VITAL SIGNS: ED Triage Vitals  Enc Vitals Group     BP 09/08/16 0619 116/90     Pulse Rate 09/08/16 0619 82     Resp 09/08/16 0619 18     Temp 09/08/16 0619 98.7 F (37.1 C)     Temp Source 09/08/16 0619 Oral     SpO2 09/08/16 0619 97 %     Weight 09/08/16 0621 273 lb (123.8 kg)     Height 09/08/16 0621 5\' 5"  (1.651 m)     Head Circumference --      Peak Flow --      Pain Score 09/08/16 0621 1     Pain Loc --      Pain Edu? --      Excl. in GC? --     Vital signs reviewed, nursing assessments reviewed.   Constitutional:   Alert and oriented. Well appearing and in no distress.  ENT   Head:   Normocephalic and atraumatic.     Mouth/Throat:   MMM, no pharyngeal erythema. No peritonsillar mass.  Hematological/Lymphatic/Immunilogical:   No cervical lymphadenopathy. Cardiovascular:    RRR. Symmetric bilateral radial and DP pulses.  No murmurs.  Respiratory:   Normal respiratory effort without tachypnea nor retractions. Breath sounds are clear and equal bilaterally. No wheezes/rales/rhonchi.  Musculoskeletal:   Tenderness in the distal phalanx of the left great toe. Laceration on the medial and lateral aspect of the left great toe at the base of the nail. Wound is hemostatic.Marland Kitchen Neurologic:   Normal speech and language.  CN 2-10 normal. Motor grossly intact. Tendon function intact in the  toe No gross focal neurologic deficits are appreciated.  Skin:    Skin is warm, dry with lacerations as above on the left great toe. No other notable skin findings on the left lower extremity.  ____________________________________________    LABS (pertinent positives/negatives) (all labs ordered are listed, but only abnormal results are displayed) Labs Reviewed - No data to display ____________________________________________   EKG    ____________________________________________    RADIOLOGY  X-ray left foot reveals a fracture through the distal phalanx of the left great toe without articular involvement, no significant displacement.  ____________________________________________     PROCEDURES Procedures LACERATION REPAIR Performed by: Scotty CourtSTAFFORD, Brissa Asante Authorized by: Sharman CheekSTAFFORD, Keyante Durio Consent: Verbal consent obtained. Risks and benefits: risks, benefits and alternatives were discussed Consent given by: patient Patient identity confirmed: provided demographic data Prepped and Draped in normal sterile fashion Wound explored  Laceration Location: Left great toe medial aspect   Laceration Length: 2cm  No Foreign Bodies seen or palpated  Anesthesia: Digital block   Local anesthetic: lidocaine 1% with epinephrine  Anesthetic total: 1 ml  Irrigation method: syringe Amount of cleaning: standard  Skin closure: 4-0 Monocryl   Number of sutures: 2  Technique:  Simple interrupted   Patient tolerance: Patient tolerated the procedure well with no immediate complications.  ___________   LACERATION REPAIR Performed by: Sharman CheekSTAFFORD, Tay Whitwell Authorized by: Sharman CheekSTAFFORD, Jenaveve Fenstermaker Consent: Verbal consent obtained. Risks and benefits: risks, benefits and alternatives were discussed Consent given by: patient Patient identity confirmed: provided demographic data Prepped and Draped in normal sterile fashion Wound explored  Laceration Location: Left great toe lateral aspect  Laceration Length: 2.5cm  No Foreign Bodies seen or palpated  Anesthesia: Digital block   Local anesthetic: lidocaine 1% with epinephrine  Anesthetic total: 1 ml  Irrigation method: syringe Amount of cleaning: standard  Skin closure: 4-0 Monocryl   Number of sutures: 3  Technique: Simple interrupted   Patient tolerance: Patient tolerated the procedure well with no immediate complications. _________________________________   INITIAL IMPRESSION / ASSESSMENT AND PLAN / ED COURSE  Pertinent labs & imaging results that were available during my care of the patient were reviewed by me and considered in my medical decision making (see chart for details).  Patient presents with left great toe injury. Found to have a nondisplaced fracture associated with some lacerations. She is not sure when her last tetanus vaccine was so this was given in the emergency department. After having the patient soak the toe in a diluted iodine bath for anti-sepsis, the area was further cleansed, digital block applied, sterilely draped. After repair of the 2 lacerations, the toe felt stabilized and nail bed was not significantly disrupted. I did counsel the patient that she will likely lose the nail but without any apparent further injury to the nailbed or nail matrix, I expect it will likely grow back although this cannot be known at this time. Advised her to follow up with podiatry in 2 days. Absorbable  sutures used. Start the patient on Augmentin for prophylaxis given the nature of the injury, but does not involve the joint and does not require urgent surgical intervention.     Clinical Course   ____________________________________________   FINAL CLINICAL IMPRESSION(S) / ED DIAGNOSES  Final diagnoses:  Other fracture of left great toe, initial encounter for open fracture  Toe laceration, initial encounter       Portions of this note were generated with dragon dictation software. Dictation errors may occur despite best attempts at proofreading.  Sharman Cheek, MD 09/08/16 1003

## 2016-09-08 NOTE — Discharge Instructions (Signed)
You have a fracture in the outer bone of the left big toe.  Keep the toe stabilized to the adjacent toe, clean, and dry.  Your injury also caused lacerations near the base of the nail on both sides, which were repaired with absorbable sutures.  These will fall out on their own.    Follow up with podiatry for continued monitoring of your symptoms.  Return to the ER if you have redness, swelling, fever, worsening pain, or foul discharge from the wound.

## 2016-09-08 NOTE — ED Triage Notes (Signed)
Pt brought to triage via wheelchair. Pt reports her left great toenail caught on the carpet and pulled her toenail back. Pt has pain and bleeding to her left great toenail.

## 2017-01-31 ENCOUNTER — Ambulatory Visit: Payer: Medicare Other | Attending: Neurology

## 2017-01-31 DIAGNOSIS — G4733 Obstructive sleep apnea (adult) (pediatric): Secondary | ICD-10-CM | POA: Diagnosis present

## 2017-02-13 ENCOUNTER — Ambulatory Visit: Payer: Medicare Other | Attending: Neurology

## 2017-02-13 DIAGNOSIS — G4733 Obstructive sleep apnea (adult) (pediatric): Secondary | ICD-10-CM | POA: Diagnosis not present

## 2017-04-28 ENCOUNTER — Other Ambulatory Visit: Payer: Self-pay | Admitting: Preventative Medicine

## 2017-04-28 ENCOUNTER — Other Ambulatory Visit: Payer: Self-pay | Admitting: Family Medicine

## 2017-04-28 DIAGNOSIS — Z1231 Encounter for screening mammogram for malignant neoplasm of breast: Secondary | ICD-10-CM

## 2017-05-12 ENCOUNTER — Ambulatory Visit: Payer: Medicare Other | Attending: Family Medicine

## 2018-03-13 ENCOUNTER — Other Ambulatory Visit: Payer: Self-pay | Admitting: Student

## 2018-03-13 DIAGNOSIS — M25561 Pain in right knee: Principal | ICD-10-CM

## 2018-03-13 DIAGNOSIS — G8929 Other chronic pain: Secondary | ICD-10-CM

## 2018-03-20 ENCOUNTER — Ambulatory Visit
Admission: RE | Admit: 2018-03-20 | Discharge: 2018-03-20 | Disposition: A | Discharge status: Home / Self Care | Payer: Medicare Other | Source: Ambulatory Visit | Attending: Student | Admitting: Student

## 2018-03-20 DIAGNOSIS — M7121 Synovial cyst of popliteal space [Baker], right knee: Secondary | ICD-10-CM | POA: Insufficient documentation

## 2018-03-20 DIAGNOSIS — G8929 Other chronic pain: Secondary | ICD-10-CM | POA: Insufficient documentation

## 2018-03-20 DIAGNOSIS — M25461 Effusion, right knee: Secondary | ICD-10-CM | POA: Diagnosis not present

## 2018-03-20 DIAGNOSIS — M1711 Unilateral primary osteoarthritis, right knee: Secondary | ICD-10-CM | POA: Diagnosis not present

## 2018-03-20 DIAGNOSIS — M23303 Other meniscus derangements, unspecified medial meniscus, right knee: Secondary | ICD-10-CM | POA: Insufficient documentation

## 2018-03-20 DIAGNOSIS — M25561 Pain in right knee: Secondary | ICD-10-CM | POA: Diagnosis present

## 2018-05-14 ENCOUNTER — Other Ambulatory Visit: Payer: Medicare Other

## 2018-05-20 ENCOUNTER — Encounter
Admission: RE | Admit: 2018-05-20 | Discharge: 2018-05-20 | Disposition: A | Discharge status: Home / Self Care | Payer: Medicare Other | Source: Ambulatory Visit | Attending: Surgery | Admitting: Surgery

## 2018-05-20 ENCOUNTER — Ambulatory Visit
Admission: RE | Admit: 2018-05-20 | Discharge: 2018-05-20 | Disposition: A | Discharge status: Home / Self Care | Payer: Medicare Other | Source: Ambulatory Visit | Attending: Surgery | Admitting: Surgery

## 2018-05-20 ENCOUNTER — Other Ambulatory Visit: Payer: Self-pay

## 2018-05-20 DIAGNOSIS — Z01810 Encounter for preprocedural cardiovascular examination: Secondary | ICD-10-CM | POA: Diagnosis present

## 2018-05-20 DIAGNOSIS — Z01818 Encounter for other preprocedural examination: Secondary | ICD-10-CM | POA: Diagnosis present

## 2018-05-20 DIAGNOSIS — I517 Cardiomegaly: Secondary | ICD-10-CM | POA: Diagnosis not present

## 2018-05-20 DIAGNOSIS — Z01812 Encounter for preprocedural laboratory examination: Secondary | ICD-10-CM | POA: Insufficient documentation

## 2018-05-20 DIAGNOSIS — R918 Other nonspecific abnormal finding of lung field: Secondary | ICD-10-CM | POA: Insufficient documentation

## 2018-05-20 HISTORY — DX: Personal history of urinary calculi: Z87.442

## 2018-05-20 LAB — URINALYSIS, ROUTINE W REFLEX MICROSCOPIC
Bilirubin Urine: NEGATIVE
Glucose, UA: NEGATIVE mg/dL
Ketones, ur: NEGATIVE mg/dL
Nitrite: NEGATIVE
Protein, ur: NEGATIVE mg/dL
SPECIFIC GRAVITY, URINE: 1.01 (ref 1.005–1.030)
pH: 6 (ref 5.0–8.0)

## 2018-05-20 LAB — CBC
HEMATOCRIT: 36.2 % (ref 35.0–47.0)
HEMOGLOBIN: 12.4 g/dL (ref 12.0–16.0)
MCH: 31.6 pg (ref 26.0–34.0)
MCHC: 34.3 g/dL (ref 32.0–36.0)
MCV: 92.1 fL (ref 80.0–100.0)
Platelets: 322 10*3/uL (ref 150–440)
RBC: 3.93 MIL/uL (ref 3.80–5.20)
RDW: 12.9 % (ref 11.5–14.5)
WBC: 5.6 10*3/uL (ref 3.6–11.0)

## 2018-05-20 LAB — BASIC METABOLIC PANEL
ANION GAP: 9 (ref 5–15)
BUN: 12 mg/dL (ref 6–20)
CALCIUM: 8.8 mg/dL — AB (ref 8.9–10.3)
CHLORIDE: 102 mmol/L (ref 101–111)
CO2: 25 mmol/L (ref 22–32)
CREATININE: 0.82 mg/dL (ref 0.44–1.00)
GFR calc non Af Amer: >60 mL/min (ref >60)
Glucose, Bld: 96 mg/dL (ref 65–99)
Potassium: 3.8 mmol/L (ref 3.5–5.1)
Sodium: 136 mmol/L (ref 135–145)

## 2018-05-20 LAB — PROTIME-INR
INR: 1.07
PROTHROMBIN TIME: 13.8 s (ref 11.4–15.2)

## 2018-05-20 LAB — TYPE AND SCREEN
ABO/RH(D): O POS
ANTIBODY SCREEN: NEGATIVE

## 2018-05-20 LAB — SURGICAL PCR SCREEN
MRSA, PCR: NEGATIVE
Staphylococcus aureus: NEGATIVE

## 2018-05-20 NOTE — Patient Instructions (Signed)
Your procedure is scheduled on: Thursday 05/28/18 Report to [place]. To find out your arrival time please call [phone] between 1PM - 3PM on Wednesday 05/27/18.  Remember: Instructions that are not followed completely may result in serious medical risk, up to and including death, or upon the discretion of your surgeon and anesthesiologist your surgery may need to be rescheduled.     _X__ 1. Do not eat food after midnight the night before your procedure.                 No gum chewing or hard candies. You may drink clear liquids up to 2 hours                 before you are scheduled to arrive for your surgery- DO not drink clear                 liquids within 2 hours of the start of your surgery.                 Clear Liquids include:  water, apple juice without pulp, clear carbohydrate                 drink such as Clearfast or Gatorade, Black Coffee or Tea (Do not add                 anything to coffee or tea).  __X__2.  On the morning of surgery brush your teeth with toothpaste and water, you                 may rinse your mouth with mouthwash if you wish.  Do not swallow any              toothpaste of mouthwash.     _X__ 3.  No Alcohol for 24 hours before or after surgery.   _X__ 4.  Do Not Smoke or use e-cigarettes For 24 Hours Prior to Your Surgery.                 Do not use any chewable tobacco products for at least 6 hours prior to                 surgery.  ____  5.  Bring all medications with you on the day of surgery if instructed.   __X__  6.  Notify your doctor if there is any change in your medical condition      (cold, fever, infections).     Do not wear jewelry, make-up, hairpins, clips or nail polish. Do not wear lotions, powders, or perfumes.  Do not shave 48 hours prior to surgery. Men may shave face and neck. Do not bring valuables to the hospital.    [facility] is not responsible for any belongings or  valuables.  Contacts, dentures/partials or body piercings may not be worn into surgery. Bring a case for your contacts, glasses or hearing aids, a denture cup will be supplied. Leave your suitcase in the car. After surgery it may be brought to your room. For patients admitted to the hospital, discharge time is determined by your treatment team.   Patients discharged the day of surgery will not be allowed to drive home.   Please read over the following fact sheets that you were given:   MRSA Information  __X__ Take these medicines the morning of surgery with A SIP OF WATER:  1. GABAPENTIN  2. OMEPRAZOLE AT BEDTIME AND AGAIN THE AM OF SURGERY  3. PROPRANOLOL  4. VENLAFAXINE  5.  6.  ____ Fleet Enema (as directed)   __X__ Use CHG Soap/SAGE wipes as directed  __X__ Use inhalers on the day of surgery  ____ Stop metformin/Janumet/Farxiga 2 days prior to surgery    ____ Take 1/2 of usual insulin dose the night before surgery. No insulin the morning          of surgery.   __X__ Stop Blood Thinners Coumadin/Plavix/Xarelto/Pleta/Pradaxa/Eliquis/Effient/Aspirin  on TODAY  Or contact your Surgeon, Cardiologist or Medical Doctor regarding  ability to stop your blood thinners  __X__ Stop Anti-inflammatories 7 days before surgery such as Advil, Ibuprofen, Motrin,  BC or Goodies Powder, Naprosyn, Naproxen, Aleve, Aspirin    __X__ Stop all herbal supplements, fish oil or vitamin E until after surgery.    __X__ Bring C-Pap to the hospital.

## 2018-05-21 NOTE — Pre-Procedure Instructions (Signed)
LABS/CXR FAXED TO DR Joice Lofts

## 2018-05-27 ENCOUNTER — Encounter: Payer: Self-pay | Admitting: *Deleted

## 2018-05-27 MED ORDER — DEXTROSE 5 % IV SOLN
3.0000 g | Freq: Once | INTRAVENOUS | Status: AC
  Administered 2018-05-28: 3 g via INTRAVENOUS
  Filled 2018-05-27: qty 3

## 2018-05-28 ENCOUNTER — Encounter
Admission: RE | Disposition: A | Discharge status: Home Health | Payer: Self-pay | Source: Ambulatory Visit | Attending: Surgery

## 2018-05-28 ENCOUNTER — Inpatient Hospital Stay: Payer: Medicare Other | Admitting: Certified Registered Nurse Anesthetist

## 2018-05-28 ENCOUNTER — Other Ambulatory Visit: Payer: Self-pay

## 2018-05-28 ENCOUNTER — Inpatient Hospital Stay
Admission: RE | Admit: 2018-05-28 | Discharge: 2018-05-30 | DRG: 470 | Disposition: A | Discharge status: Home Health | Payer: Medicare Other | Source: Ambulatory Visit | Attending: Surgery | Admitting: Surgery

## 2018-05-28 ENCOUNTER — Encounter: Payer: Self-pay | Admitting: *Deleted

## 2018-05-28 ENCOUNTER — Inpatient Hospital Stay: Payer: Medicare Other

## 2018-05-28 DIAGNOSIS — Z6841 Body Mass Index (BMI) 40.0 and over, adult: Secondary | ICD-10-CM | POA: Diagnosis not present

## 2018-05-28 DIAGNOSIS — M1711 Unilateral primary osteoarthritis, right knee: Principal | ICD-10-CM | POA: Diagnosis present

## 2018-05-28 DIAGNOSIS — Z7982 Long term (current) use of aspirin: Secondary | ICD-10-CM | POA: Diagnosis not present

## 2018-05-28 DIAGNOSIS — G2581 Restless legs syndrome: Secondary | ICD-10-CM | POA: Diagnosis present

## 2018-05-28 DIAGNOSIS — E7800 Pure hypercholesterolemia, unspecified: Secondary | ICD-10-CM | POA: Diagnosis present

## 2018-05-28 DIAGNOSIS — F319 Bipolar disorder, unspecified: Secondary | ICD-10-CM | POA: Diagnosis present

## 2018-05-28 DIAGNOSIS — I10 Essential (primary) hypertension: Secondary | ICD-10-CM | POA: Diagnosis present

## 2018-05-28 DIAGNOSIS — K219 Gastro-esophageal reflux disease without esophagitis: Secondary | ICD-10-CM | POA: Diagnosis present

## 2018-05-28 DIAGNOSIS — Z96651 Presence of right artificial knee joint: Secondary | ICD-10-CM

## 2018-05-28 HISTORY — PX: TOTAL KNEE ARTHROPLASTY: SHX125

## 2018-05-28 SURGERY — ARTHROPLASTY, KNEE, TOTAL
Anesthesia: Spinal | Site: Knee | Laterality: Right | Wound class: Clean

## 2018-05-28 MED ORDER — PHENYLEPHRINE HCL 10 MG/ML IJ SOLN
INTRAMUSCULAR | Status: AC
  Filled 2018-05-28: qty 1

## 2018-05-28 MED ORDER — ACETAMINOPHEN 500 MG PO TABS
1000.0000 mg | ORAL_TABLET | Freq: Four times a day (QID) | ORAL | Status: AC
  Administered 2018-05-28 – 2018-05-29 (×3): 1000 mg via ORAL
  Filled 2018-05-28 (×3): qty 2

## 2018-05-28 MED ORDER — ENOXAPARIN SODIUM 40 MG/0.4ML ~~LOC~~ SOLN
40.0000 mg | SUBCUTANEOUS | Status: DC
Start: 2018-05-29 — End: 2018-05-28

## 2018-05-28 MED ORDER — ALBUTEROL SULFATE HFA 108 (90 BASE) MCG/ACT IN AERS: 1.0000 | INHALATION_SPRAY | Freq: Four times a day (QID) | RESPIRATORY_TRACT | Status: DC | PRN

## 2018-05-28 MED ORDER — FENTANYL CITRATE (PF) 100 MCG/2ML IJ SOLN: 25.0000 ug | INTRAMUSCULAR | Status: DC | PRN

## 2018-05-28 MED ORDER — CALCIUM CARBONATE-VITAMIN D 500-200 MG-UNIT PO TABS
1.0000 | ORAL_TABLET | Freq: Two times a day (BID) | ORAL | Status: DC
  Administered 2018-05-28 – 2018-05-30 (×4): 1 via ORAL
  Filled 2018-05-28 (×4): qty 1

## 2018-05-28 MED ORDER — PROPOFOL 500 MG/50ML IV EMUL
INTRAVENOUS | Status: AC
  Filled 2018-05-28: qty 50

## 2018-05-28 MED ORDER — VENLAFAXINE HCL ER 150 MG PO CP24
150.0000 mg | ORAL_CAPSULE | Freq: Every day | ORAL | Status: DC
  Administered 2018-05-29 – 2018-05-30 (×2): 150 mg via ORAL
  Filled 2018-05-28 (×2): qty 1

## 2018-05-28 MED ORDER — LIDOCAINE HCL (CARDIAC) PF 100 MG/5ML IV SOSY
PREFILLED_SYRINGE | INTRAVENOUS | Status: DC | PRN
  Administered 2018-05-28: 60 mg via INTRAVENOUS

## 2018-05-28 MED ORDER — ENOXAPARIN SODIUM 40 MG/0.4ML ~~LOC~~ SOLN
40.0000 mg | Freq: Two times a day (BID) | SUBCUTANEOUS | Status: DC
  Administered 2018-05-29 – 2018-05-30 (×3): 40 mg via SUBCUTANEOUS
  Filled 2018-05-28 (×3): qty 0.4

## 2018-05-28 MED ORDER — SODIUM CHLORIDE 0.9 % IV SOLN
INTRAVENOUS | Status: DC
  Administered 2018-05-28 (×2): via INTRAVENOUS

## 2018-05-28 MED ORDER — EPHEDRINE SULFATE 50 MG/ML IJ SOLN
INTRAMUSCULAR | Status: AC
  Filled 2018-05-28: qty 1

## 2018-05-28 MED ORDER — AMITRIPTYLINE HCL 25 MG PO TABS
25.0000 mg | ORAL_TABLET | Freq: Every day | ORAL | Status: DC
  Administered 2018-05-28 – 2018-05-29 (×2): 25 mg via ORAL
  Filled 2018-05-28 (×2): qty 1

## 2018-05-28 MED ORDER — ONDANSETRON HCL 4 MG/2ML IJ SOLN
INTRAMUSCULAR | Status: DC | PRN
Start: 2018-05-28 — End: 2018-05-28
  Administered 2018-05-28: 4 mg via INTRAVENOUS

## 2018-05-28 MED ORDER — PROPRANOLOL HCL ER 80 MG PO CP24
80.0000 mg | ORAL_CAPSULE | Freq: Every day | ORAL | Status: DC
  Administered 2018-05-29 – 2018-05-30 (×2): 80 mg via ORAL
  Filled 2018-05-28 (×3): qty 1

## 2018-05-28 MED ORDER — TRAMADOL HCL 50 MG PO TABS
50.0000 mg | ORAL_TABLET | Freq: Four times a day (QID) | ORAL | Status: DC | PRN
  Administered 2018-05-29 – 2018-05-30 (×3): 50 mg via ORAL
  Filled 2018-05-28 (×4): qty 1

## 2018-05-28 MED ORDER — HYDROCHLOROTHIAZIDE 12.5 MG PO CAPS
12.5000 mg | ORAL_CAPSULE | Freq: Every day | ORAL | Status: DC
  Administered 2018-05-28 – 2018-05-30 (×3): 12.5 mg via ORAL
  Filled 2018-05-28 (×3): qty 1

## 2018-05-28 MED ORDER — LISINOPRIL-HYDROCHLOROTHIAZIDE 20-12.5 MG PO TABS: 1.0000 | ORAL_TABLET | Freq: Every day | ORAL | Status: DC

## 2018-05-28 MED ORDER — GLYCOPYRROLATE 0.2 MG/ML IJ SOLN
INTRAMUSCULAR | Status: AC
  Filled 2018-05-28: qty 1

## 2018-05-28 MED ORDER — ALBUTEROL SULFATE (2.5 MG/3ML) 0.083% IN NEBU: 2.5000 mg | INHALATION_SOLUTION | Freq: Four times a day (QID) | RESPIRATORY_TRACT | Status: DC | PRN

## 2018-05-28 MED ORDER — SODIUM CHLORIDE 0.9 % IV SOLN
INTRAVENOUS | Status: DC | PRN
  Administered 2018-05-28: 60 mL

## 2018-05-28 MED ORDER — METOCLOPRAMIDE HCL 10 MG PO TABS: 5.0000 mg | ORAL_TABLET | Freq: Three times a day (TID) | ORAL | Status: DC | PRN

## 2018-05-28 MED ORDER — EPHEDRINE SULFATE 50 MG/ML IJ SOLN
INTRAMUSCULAR | Status: DC | PRN
  Administered 2018-05-28 (×2): 15 mg via INTRAVENOUS

## 2018-05-28 MED ORDER — ACETAMINOPHEN 10 MG/ML IV SOLN
INTRAVENOUS | Status: DC | PRN
  Administered 2018-05-28: 1000 mg via INTRAVENOUS

## 2018-05-28 MED ORDER — KETOROLAC TROMETHAMINE 15 MG/ML IJ SOLN
15.0000 mg | Freq: Four times a day (QID) | INTRAMUSCULAR | Status: AC
  Administered 2018-05-28 – 2018-05-29 (×3): 15 mg via INTRAVENOUS
  Filled 2018-05-28 (×3): qty 1

## 2018-05-28 MED ORDER — PROPOFOL 500 MG/50ML IV EMUL
INTRAVENOUS | Status: DC | PRN
  Administered 2018-05-28: 50 ug/kg/min via INTRAVENOUS

## 2018-05-28 MED ORDER — LIDOCAINE HCL (PF) 2 % IJ SOLN
INTRAMUSCULAR | Status: AC
  Filled 2018-05-28: qty 10

## 2018-05-28 MED ORDER — FLEET ENEMA 7-19 GM/118ML RE ENEM: 1.0000 | ENEMA | Freq: Once | RECTAL | Status: DC | PRN

## 2018-05-28 MED ORDER — LURASIDONE HCL 80 MG PO TABS
80.0000 mg | ORAL_TABLET | Freq: Every day | ORAL | Status: DC
  Administered 2018-05-28 – 2018-05-29 (×2): 80 mg via ORAL
  Filled 2018-05-28 (×3): qty 1

## 2018-05-28 MED ORDER — LURASIDONE HCL 20 MG PO TABS: 20.0000 mg | ORAL_TABLET | Freq: Every day | ORAL | Status: DC

## 2018-05-28 MED ORDER — SODIUM CHLORIDE 0.9 % IV SOLN
INTRAVENOUS | Status: DC | PRN
  Administered 2018-05-28: 30 ug/min via INTRAVENOUS

## 2018-05-28 MED ORDER — BUPIVACAINE HCL (PF) 0.5 % IJ SOLN
INTRAMUSCULAR | Status: DC | PRN
  Administered 2018-05-28: 3 mL

## 2018-05-28 MED ORDER — OXYCODONE HCL 5 MG PO TABS
5.0000 mg | ORAL_TABLET | ORAL | Status: DC | PRN
  Administered 2018-05-29: 10 mg via ORAL
  Administered 2018-05-29 (×2): 5 mg via ORAL
  Administered 2018-05-29 – 2018-05-30 (×2): 10 mg via ORAL
  Filled 2018-05-28: qty 2
  Filled 2018-05-28: qty 1
  Filled 2018-05-28: qty 2
  Filled 2018-05-28: qty 1
  Filled 2018-05-28: qty 2

## 2018-05-28 MED ORDER — TRANEXAMIC ACID 1000 MG/10ML IV SOLN
INTRAVENOUS | Status: AC
  Filled 2018-05-28: qty 10

## 2018-05-28 MED ORDER — SODIUM CHLORIDE 0.9 % IJ SOLN
INTRAMUSCULAR | Status: AC
  Filled 2018-05-28: qty 40

## 2018-05-28 MED ORDER — DEXAMETHASONE SODIUM PHOSPHATE 10 MG/ML IJ SOLN
INTRAMUSCULAR | Status: AC
  Filled 2018-05-28: qty 1

## 2018-05-28 MED ORDER — MAGNESIUM HYDROXIDE 400 MG/5ML PO SUSP
30.0000 mL | Freq: Every day | ORAL | Status: DC | PRN
  Administered 2018-05-29: 30 mL via ORAL
  Filled 2018-05-28: qty 30

## 2018-05-28 MED ORDER — LISINOPRIL 20 MG PO TABS
20.0000 mg | ORAL_TABLET | Freq: Every day | ORAL | Status: DC
Start: 2018-05-28 — End: 2018-05-30
  Administered 2018-05-28 – 2018-05-30 (×3): 20 mg via ORAL
  Filled 2018-05-28 (×3): qty 1

## 2018-05-28 MED ORDER — DEXAMETHASONE SODIUM PHOSPHATE 10 MG/ML IJ SOLN
INTRAMUSCULAR | Status: DC | PRN
  Administered 2018-05-28: 10 mg via INTRAVENOUS

## 2018-05-28 MED ORDER — PANTOPRAZOLE SODIUM 40 MG PO TBEC
40.0000 mg | DELAYED_RELEASE_TABLET | Freq: Every day | ORAL | Status: DC
  Administered 2018-05-29 – 2018-05-30 (×2): 40 mg via ORAL
  Filled 2018-05-28 (×2): qty 1

## 2018-05-28 MED ORDER — KETOROLAC TROMETHAMINE 30 MG/ML IJ SOLN
30.0000 mg | Freq: Once | INTRAMUSCULAR | Status: DC
  Filled 2018-05-28: qty 1

## 2018-05-28 MED ORDER — ONDANSETRON HCL 4 MG/2ML IJ SOLN: 4.0000 mg | Freq: Once | INTRAMUSCULAR | Status: DC | PRN

## 2018-05-28 MED ORDER — ROPINIROLE HCL 1 MG PO TABS
4.0000 mg | ORAL_TABLET | Freq: Every day | ORAL | Status: DC
  Administered 2018-05-28 – 2018-05-29 (×2): 4 mg via ORAL
  Filled 2018-05-28 (×2): qty 4

## 2018-05-28 MED ORDER — GABAPENTIN 600 MG PO TABS
600.0000 mg | ORAL_TABLET | Freq: Two times a day (BID) | ORAL | Status: DC
  Administered 2018-05-28 – 2018-05-30 (×4): 600 mg via ORAL
  Filled 2018-05-28 (×6): qty 1

## 2018-05-28 MED ORDER — MIDAZOLAM HCL 2 MG/2ML IJ SOLN
INTRAMUSCULAR | Status: AC
  Filled 2018-05-28: qty 2

## 2018-05-28 MED ORDER — ONDANSETRON HCL 4 MG/2ML IJ SOLN: 4.0000 mg | Freq: Four times a day (QID) | INTRAMUSCULAR | Status: DC | PRN

## 2018-05-28 MED ORDER — BUPIVACAINE LIPOSOME 1.3 % IJ SUSP
INTRAMUSCULAR | Status: AC
  Filled 2018-05-28: qty 20

## 2018-05-28 MED ORDER — BISACODYL 10 MG RE SUPP: 10.0000 mg | Freq: Every day | RECTAL | Status: DC | PRN

## 2018-05-28 MED ORDER — DIPHENHYDRAMINE HCL 12.5 MG/5ML PO ELIX: 12.5000 mg | ORAL_SOLUTION | ORAL | Status: DC | PRN

## 2018-05-28 MED ORDER — BUPIVACAINE-EPINEPHRINE (PF) 0.5% -1:200000 IJ SOLN
INTRAMUSCULAR | Status: DC | PRN
  Administered 2018-05-28: 30 mL via PERINEURAL

## 2018-05-28 MED ORDER — PRAVASTATIN SODIUM 20 MG PO TABS
10.0000 mg | ORAL_TABLET | Freq: Every day | ORAL | Status: DC
  Administered 2018-05-28 – 2018-05-29 (×2): 10 mg via ORAL
  Filled 2018-05-28 (×2): qty 1

## 2018-05-28 MED ORDER — TRANEXAMIC ACID 1000 MG/10ML IV SOLN
INTRAVENOUS | Status: DC | PRN
  Administered 2018-05-28: 1000 mg via INTRAVENOUS

## 2018-05-28 MED ORDER — ACETAMINOPHEN 10 MG/ML IV SOLN
INTRAVENOUS | Status: AC
  Filled 2018-05-28: qty 100

## 2018-05-28 MED ORDER — METOCLOPRAMIDE HCL 5 MG/ML IJ SOLN: 5.0000 mg | Freq: Three times a day (TID) | INTRAMUSCULAR | Status: DC | PRN

## 2018-05-28 MED ORDER — BUPIVACAINE HCL (PF) 0.5 % IJ SOLN
INTRAMUSCULAR | Status: AC
  Filled 2018-05-28: qty 10

## 2018-05-28 MED ORDER — ONDANSETRON HCL 4 MG/2ML IJ SOLN
INTRAMUSCULAR | Status: AC
  Filled 2018-05-28: qty 2

## 2018-05-28 MED ORDER — NEOMYCIN-POLYMYXIN B GU 40-200000 IR SOLN
Status: DC | PRN
  Administered 2018-05-28: 16 mL

## 2018-05-28 MED ORDER — ONDANSETRON HCL 4 MG PO TABS: 4.0000 mg | ORAL_TABLET | Freq: Four times a day (QID) | ORAL | Status: DC | PRN

## 2018-05-28 MED ORDER — NEOMYCIN-POLYMYXIN B GU 40-200000 IR SOLN
Status: AC
  Filled 2018-05-28: qty 20

## 2018-05-28 MED ORDER — MIDAZOLAM HCL 5 MG/5ML IJ SOLN
INTRAMUSCULAR | Status: DC | PRN
  Administered 2018-05-28: 2 mg via INTRAVENOUS

## 2018-05-28 MED ORDER — DEXTROSE 5 % IV SOLN
3.0000 g | Freq: Four times a day (QID) | INTRAVENOUS | Status: AC
  Administered 2018-05-28 – 2018-05-29 (×3): 3 g via INTRAVENOUS
  Filled 2018-05-28 (×3): qty 3

## 2018-05-28 MED ORDER — GLYCOPYRROLATE 0.2 MG/ML IJ SOLN
INTRAMUSCULAR | Status: DC | PRN
  Administered 2018-05-28: 0.2 mg via INTRAVENOUS

## 2018-05-28 MED ORDER — FERROUS SULFATE 325 (65 FE) MG PO TABS
325.0000 mg | ORAL_TABLET | Freq: Every day | ORAL | Status: DC
  Administered 2018-05-29 – 2018-05-30 (×2): 325 mg via ORAL
  Filled 2018-05-28 (×2): qty 1

## 2018-05-28 MED ORDER — HYDROMORPHONE HCL 1 MG/ML IJ SOLN: 0.5000 mg | INTRAMUSCULAR | Status: DC | PRN

## 2018-05-28 MED ORDER — LACTATED RINGERS IV SOLN
INTRAVENOUS | Status: DC
  Administered 2018-05-28: 07:00:00 via INTRAVENOUS

## 2018-05-28 MED ORDER — ASPIRIN EC 81 MG PO TBEC
81.0000 mg | DELAYED_RELEASE_TABLET | Freq: Two times a day (BID) | ORAL | Status: DC
  Administered 2018-05-28 – 2018-05-30 (×4): 81 mg via ORAL
  Filled 2018-05-28 (×4): qty 1

## 2018-05-28 MED ORDER — ACETAMINOPHEN 325 MG PO TABS: 325.0000 mg | ORAL_TABLET | Freq: Four times a day (QID) | ORAL | Status: DC | PRN

## 2018-05-28 MED ORDER — BUPIVACAINE-EPINEPHRINE (PF) 0.5% -1:200000 IJ SOLN
INTRAMUSCULAR | Status: AC
  Filled 2018-05-28: qty 30

## 2018-05-28 MED ORDER — DOCUSATE SODIUM 100 MG PO CAPS
100.0000 mg | ORAL_CAPSULE | Freq: Two times a day (BID) | ORAL | Status: DC
  Administered 2018-05-28 – 2018-05-29 (×4): 100 mg via ORAL
  Filled 2018-05-28 (×5): qty 1

## 2018-05-28 SURGICAL SUPPLY — 60 items
BANDAGE ELASTIC 6 LF NS (GAUZE/BANDAGES/DRESSINGS) ×3 IMPLANT
BEARING TIBIAL VG AS 75X12 (Joint) ×1 IMPLANT
BLADE SAW SAG 25X90X1.19 (BLADE) ×3 IMPLANT
BLADE SURG SZ20 CARB STEEL (BLADE) ×3 IMPLANT
CANISTER SUCT 1200ML W/VALVE (MISCELLANEOUS) ×3 IMPLANT
CANISTER SUCT 3000ML PPV (MISCELLANEOUS) ×3 IMPLANT
CEMENT BONE R 1X40 (Cement) ×6 IMPLANT
CEMENT VACUUM MIXING SYSTEM (MISCELLANEOUS) ×3 IMPLANT
CHLORAPREP W/TINT 26ML (MISCELLANEOUS) ×3 IMPLANT
COOLER POLAR GLACIER W/PUMP (MISCELLANEOUS) ×3 IMPLANT
COVER MAYO STAND STRL (DRAPES) ×3 IMPLANT
CUFF TOURN 24 STER (MISCELLANEOUS) IMPLANT
CUFF TOURN 30 STER DUAL PORT (MISCELLANEOUS) IMPLANT
CUFF TOURN 34 STER (MISCELLANEOUS) ×3 IMPLANT
DRAPE IMP U-DRAPE 54X76 (DRAPES) ×3 IMPLANT
DRAPE INCISE IOBAN 66X45 STRL (DRAPES) ×3 IMPLANT
DRAPE SHEET LG 3/4 BI-LAMINATE (DRAPES) ×3 IMPLANT
DRSG OPSITE POSTOP 4X10 (GAUZE/BANDAGES/DRESSINGS) ×3 IMPLANT
DRSG OPSITE POSTOP 4X8 (GAUZE/BANDAGES/DRESSINGS) ×3 IMPLANT
ELECT CAUTERY BLADE 6.4 (BLADE) ×3 IMPLANT
ELECT REM PT RETURN 9FT ADLT (ELECTROSURGICAL) ×3
ELECTRODE REM PT RTRN 9FT ADLT (ELECTROSURGICAL) ×1 IMPLANT
FEMORAL CR RIGHT 67.5MM (Joint) ×3 IMPLANT
GLOVE BIO SURGEON STRL SZ7.5 (GLOVE) ×12 IMPLANT
GLOVE BIO SURGEON STRL SZ8 (GLOVE) ×12 IMPLANT
GLOVE BIOGEL PI IND STRL 8 (GLOVE) ×1 IMPLANT
GLOVE BIOGEL PI INDICATOR 8 (GLOVE) ×2
GLOVE INDICATOR 8.0 STRL GRN (GLOVE) ×3 IMPLANT
GOWN STRL REUS W/ TWL LRG LVL3 (GOWN DISPOSABLE) ×1 IMPLANT
GOWN STRL REUS W/ TWL XL LVL3 (GOWN DISPOSABLE) ×1 IMPLANT
GOWN STRL REUS W/TWL LRG LVL3 (GOWN DISPOSABLE) ×2
GOWN STRL REUS W/TWL XL LVL3 (GOWN DISPOSABLE) ×2
HOLDER FOLEY CATH W/STRAP (MISCELLANEOUS) ×3 IMPLANT
HOOD PEEL AWAY FLYTE STAYCOOL (MISCELLANEOUS) ×9 IMPLANT
IMMBOLIZER KNEE 19 BLUE UNIV (SOFTGOODS) ×3 IMPLANT
KIT TURNOVER KIT A (KITS) ×3 IMPLANT
NDL SAFETY ECLIPSE 18X1.5 (NEEDLE) ×2 IMPLANT
NEEDLE HYPO 18GX1.5 SHARP (NEEDLE) ×4
NEEDLE SPNL 20GX3.5 QUINCKE YW (NEEDLE) ×3 IMPLANT
NS IRRIG 1000ML POUR BTL (IV SOLUTION) ×3 IMPLANT
PACK TOTAL KNEE (MISCELLANEOUS) ×3 IMPLANT
PAD WRAPON POLAR KNEE (MISCELLANEOUS) ×1 IMPLANT
PATELLA STD 34X8.5 (Orthopedic Implant) ×3 IMPLANT
PLATE KNEE TIBIAL 75MM FIXED (Plate) ×3 IMPLANT
PULSAVAC PLUS IRRIG FAN TIP (DISPOSABLE) ×3
SOL .9 NS 3000ML IRR  AL (IV SOLUTION) ×2
SOL .9 NS 3000ML IRR UROMATIC (IV SOLUTION) ×1 IMPLANT
STAPLER SKIN PROX 35W (STAPLE) ×3 IMPLANT
SUCTION FRAZIER HANDLE 10FR (MISCELLANEOUS) ×2
SUCTION TUBE FRAZIER 10FR DISP (MISCELLANEOUS) ×1 IMPLANT
SUT VIC AB 0 CT1 36 (SUTURE) ×9 IMPLANT
SUT VIC AB 2-0 CT1 27 (SUTURE) ×6
SUT VIC AB 2-0 CT1 TAPERPNT 27 (SUTURE) ×3 IMPLANT
SYR 10ML LL (SYRINGE) ×3 IMPLANT
SYR 20CC LL (SYRINGE) ×3 IMPLANT
SYR 30ML LL (SYRINGE) ×9 IMPLANT
TIBIAL BEARING VG AS 75X12 (Joint) ×3 IMPLANT
TIP FAN IRRIG PULSAVAC PLUS (DISPOSABLE) ×1 IMPLANT
TRAY FOLEY MTR SLVR 16FR STAT (SET/KITS/TRAYS/PACK) ×3 IMPLANT
WRAPON POLAR PAD KNEE (MISCELLANEOUS) ×3

## 2018-05-28 NOTE — Progress Notes (Signed)
Pt refused CPM machine. 

## 2018-05-28 NOTE — Evaluation (Signed)
Physical Therapy Evaluation Patient Details Name: Paula Hammond MRN: [id number] DOB: [birth year] Today's Date: 05/28/2018   History of Present Illness  Pt admitted for R TKR by Dr. Joice Lofts. L TKR 2-3 years ago  Clinical Impression  Pt is a pleasant 62 year old female who was admitted for a R TKR performed by Dr. Joice Lofts. Pt performs bed mobility, transfers, and ambulation with min-mod assistance and a RW. Pt demonstrates deficits with strength, ROM, mobility, and safety awareness. Pt demonstrates ability to perform 10 SLRs with independence, therefore does not require KI for mobility. Pt did have altered sensation on her distal anterior/lateral RLE however had full sensation in the rest of her R LE. Pt unable to DF at this time however able to perform AROM of all other motions including extension if her toes. Pt states that she was not experiencing any pain at the onset of therapy. Pt was transferred with min to mod assistance from bed to chair. Pt does display impulsivity and poor safety awareness and requires continuous redirection and cuing for safety. Pt AAROM of R knee 6-89. Pt was instructed verbally and through demonstration HEP. Pt did state that she had one bout of feeling light headed after supine<>sit however this quickly resolved. Pt tolerated treatment well. PT will continue to work with pt BID during her admission. At this time PT recommends d/c home with 24/7 supervision and home health PT.     Follow Up Recommendations Home health PT;Supervision/Assistance - 24 hour    Equipment Recommendations  Rolling Kirschbaum with 5" wheels    Recommendations for Other Services       Precautions / Restrictions Precautions Precautions: Knee;Fall Precaution Booklet Issued: No Restrictions Weight Bearing Restrictions: Yes RLE Weight Bearing: Weight bearing as tolerated      Mobility  Bed Mobility Overal bed mobility: Needs Assistance Bed Mobility: Supine to Sit     Supine to sit: Min  assist     General bed mobility comments: Patient required min assist and verbal cuing for sequencing and safety during bed mobility  Transfers Overall transfer level: Needs assistance Equipment used: Rolling Baroni (2 wheeled)(bariatric 2ww) Transfers: Sit to/from Stand Sit to Stand: Mod assist;+2 physical assistance         General transfer comment: Patient required mod assist and verbal/tactile cuing to transfer. Pt displays poor safety awareness and impulsivity  Ambulation/Gait Ambulation/Gait assistance: Min assist Ambulation Distance (Feet): 3 Feet Assistive device: Rolling Mcmenamy (2 wheeled) Gait Pattern/deviations: Shuffle     General Gait Details: patient demonstrates shuffling gait for 3 feet, impulsively took large step toward chair before being assisted <stand<>sit  Stairs            Wheelchair Mobility    Modified Rankin (Stroke Patients Only)       Balance Overall balance assessment: Needs assistance   Sitting balance-Leahy Scale: Good     Standing balance support: Bilateral upper extremity supported Standing balance-Leahy Scale: Fair Standing balance comment: Standing, RW used for BUE support                             Pertinent Vitals/Pain Pain Assessment: 0-10 Pain Score: 0-No pain Pain Location: R knee Pain Descriptors / Indicators: Operative site guarding Pain Intervention(s): Ice applied;Limited activity within patient's tolerance;Monitored during session    Home Living Family/patient expects to be discharged to:: Private residence Living Arrangements: Children(son and grand daughters) Available Help at Discharge: Family Type of Home: House  Home Access: Stairs to enter Entrance Stairs-Rails: Can reach both Entrance Stairs-Number of Steps: 3 Home Layout: One level Home Equipment: Bedside commode;Menzie - standard;Preis - 4 wheels      Prior Function Level of Independence: Independent               Hand  Dominance        Extremity/Trunk Assessment   Upper Extremity Assessment Upper Extremity Assessment: Overall WFL for tasks assessed    Lower Extremity Assessment Lower Extremity Assessment: RLE deficits/detail;LLE deficits/detail RLE Deficits / Details: grossly at least 3/5. lacks DF at this time, able to perform 10 SLR. RLE Sensation: decreased light touch(WNL except over distal anterior/lateral RLE) LLE Deficits / Details: strength grossly WNL       Communication   Communication: No difficulties  Cognition Arousal/Alertness: Awake/alert Behavior During Therapy: WFL for tasks assessed/performed(Required continued redirectioning to stay on task.) Overall Cognitive Status: Within Functional Limits for tasks assessed                                        General Comments      Exercises Total Joint Exercises Goniometric ROM: R knee AAROM 6-89 Other Exercises Other Exercises: Supine and seated exercises of R LE: ankle pumps, quad set, glut set, SLR, heel slides x10 requiring min assistance   Assessment/Plan    PT Assessment Patient needs continued PT services  PT Problem List Decreased strength;Decreased range of motion;Decreased activity tolerance;Decreased balance;Decreased mobility;Decreased safety awareness;Pain       PT Treatment Interventions DME instruction;Gait training;Stair training;Functional mobility training;Therapeutic activities;Therapeutic exercise;Balance training;Patient/family education    PT Goals (Current goals can be found in the Care Plan section)  Acute Rehab PT Goals Patient Stated Goal: "I want to go home" PT Goal Formulation: With patient Time For Goal Achievement: 06/11/18 Potential to Achieve Goals: Good Additional Goals Additional Goal #1: pt will perform bed mobility and transfers with CGA and RW to increase function    Frequency BID   Barriers to discharge        Co-evaluation               AM-PAC PT "6  Clicks" Daily Activity  Outcome Measure Difficulty turning over in bed (including adjusting bedclothes, sheets and blankets)?: Unable Difficulty moving from lying on back to sitting on the side of the bed? : Unable Difficulty sitting down on and standing up from a chair with arms (e.g., wheelchair, bedside commode, etc,.)?: Unable Help needed moving to and from a bed to chair (including a wheelchair)?: A Lot Help needed walking in hospital room?: A Lot Help needed climbing 3-5 steps with a railing? : Total 6 Click Score: 8    End of Session Equipment Utilized During Treatment: Gait belt Activity Tolerance: Patient tolerated treatment well;Patient limited by pain Patient left: in chair;with chair alarm set;with SCD's reapplied;with nursing/sitter in room;with call bell/phone within reach   PT Visit Diagnosis: Unsteadiness on feet (R26.81);Muscle weakness (generalized) (M62.81);Pain Pain - Right/Left: Right Pain - part of body: Knee    Time: [id number] PT Time Calculation (min) (ACUTE ONLY): 44 min   Charges:         PT G Codes:        Loray Akard, SPT   Elihue Ebert 05/28/2018, 5:29 PM

## 2018-05-28 NOTE — Discharge Instructions (Signed)

## 2018-05-28 NOTE — Progress Notes (Signed)
Anticoagulation monitoring(Lovenox):  62yo  F ordered Lovenox 40 mg Q24h  Filed Weights   05/28/18 0622  Weight: 295 lb (133.8 kg)   BMI 50.61   Lab Results  Component Value Date   CREATININE 0.82 05/20/2018   CREATININE 0.65 11/23/2015   CREATININE 0.84 11/22/2015   Estimated Creatinine Clearance: 96.9 mL/min (by C-G formula based on SCr of 0.82 mg/dL). Hemoglobin & Hematocrit     Component Value Date/Time   HGB 12.4 05/20/2018 1546   HGB 12.8 07/09/2014 1209   HCT 36.2 05/20/2018 1546   HCT 38.9 07/09/2014 1209     Per Protocol for Patient with estCrcl > 30 ml/min and BMI > 40, will transition to Lovenox 40 mg Q12h.      Bari MantisKristin Rexton Greulich PharmD Clinical Pharmacist 05/28/2018

## 2018-05-28 NOTE — Anesthesia Preprocedure Evaluation (Signed)
Anesthesia Evaluation  Patient identified by MRN, date of birth, ID band Patient awake    Reviewed: Allergy & Precautions, NPO status , Patient's Chart, lab work & pertinent test results, reviewed documented beta blocker date and time   History of Anesthesia Complications (+) PONV and history of anesthetic complications  Airway Mallampati: I  TM Distance: >3 FB     Dental  (+) Chipped, Dental Advidsory Given, Missing   Pulmonary neg shortness of breath, sleep apnea and Continuous Positive Airway Pressure Ventilation , neg COPD, neg recent URI,           Cardiovascular Exercise Tolerance: Good hypertension, Pt. on medications (-) angina(-) CAD, (-) Past MI, (-) Cardiac Stents and (-) CABG (-) dysrhythmias (-) Valvular Problems/Murmurs     Neuro/Psych Seizures -, Well Controlled,  PSYCHIATRIC DISORDERS Anxiety Depression Bipolar Disorder    GI/Hepatic Neg liver ROS, GERD  ,  Endo/Other  neg diabetesMorbid obesity  Renal/GU Renal InsufficiencyRenal disease     Musculoskeletal  (+) Arthritis ,   Abdominal   Peds  Hematology negative hematology ROS (+)   Anesthesia Other Findings Past Medical History: No date: Anxiety No date: Arthritis No date: Bipolar disorder (HCC) No date: Depression No date: GERD (gastroesophageal reflux disease) No date: History of kidney stones No date: Hypercholesteremia No date: Hypertension No date: Kidney stones 1991: PONV (postoperative nausea and vomiting)     Comment:  [place] No date: Restless leg No date: Seizures (HCC)   Reproductive/Obstetrics negative OB ROS                             Anesthesia Physical  Anesthesia Plan  ASA: III  Anesthesia Plan: Spinal   Post-op Pain Management:    Induction:   PONV Risk Score and Plan: 3 and Propofol infusion  Airway Management Planned: Nasal Cannula  Additional Equipment:    Intra-op Plan:   Post-operative Plan:   Informed Consent: I have reviewed the patients History and Physical, chart, labs and discussed the procedure including the risks, benefits and alternatives for the proposed anesthesia with the patient or authorized representative who has indicated his/her understanding and acceptance.     Plan Discussed with: CRNA  Anesthesia Plan Comments:         Anesthesia Quick Evaluation

## 2018-05-28 NOTE — NC FL2 (Signed)
[place] MEDICAID FL2 LEVEL OF CARE SCREENING TOOL     IDENTIFICATION  Patient Name: Paula Hammond Birthdate: 02-08-1956 Sex: female Admission Date (Current Location): 05/28/2018  [place] and [place] Number:  Chiropodist and Address:  [facility], [address], [place], [place] [address]      Provider Number: [id number]  Attending Physician Name and Address:  Christena Flake, MD  Relative Name and Phone Number:       Current Level of Care: Hospital Recommended Level of Care: Skilled Nursing Facility Prior Approval Number:    Date Approved/Denied:   PASRR Number: [id number])  Discharge Plan: SNF    Current Diagnoses: Patient Active Problem List   Diagnosis Date Noted  . Status post total knee replacement using cement, right 05/28/2018  . Status post total left knee replacement using cement 11/21/2015    Orientation RESPIRATION BLADDER Height & Weight     Self, Time, Situation, Place  Normal Continent Weight: 295 lb (133.8 kg) Height:  5\' 4"  (162.6 cm)  BEHAVIORAL SYMPTOMS/MOOD NEUROLOGICAL BOWEL NUTRITION STATUS      Continent Diet(Diet: Clear Liquid to be advanced. )  AMBULATORY STATUS COMMUNICATION OF NEEDS Skin   Extensive Assist Verbally Surgical wounds(Incision: Right Knee. )                       Personal Care Assistance Level of Assistance  Bathing, Feeding, Dressing Bathing Assistance: Limited assistance Feeding assistance: Independent Dressing Assistance: Limited assistance     Functional Limitations Info  Sight, Hearing, Speech Sight Info: Adequate Hearing Info: Adequate Speech Info: Adequate    SPECIAL CARE FACTORS FREQUENCY  PT (By licensed PT), OT (By licensed OT)     PT Frequency: (5) OT Frequency: (5)            Contractures      Additional Factors Info  Code Status, Allergies Code Status Info: (Full Code. ) Allergies Info: (Mellaril Thioridazine)           Current Medications  (05/28/2018):  This is the current hospital active medication list Current Facility-Administered Medications  Medication Dose Route Frequency Provider Last Rate Last Dose  . 0.9 %  sodium chloride infusion   Intravenous Continuous Poggi, Excell Seltzer, MD 100 mL/hr at 05/28/18 1134    . acetaminophen (TYLENOL) tablet 1,000 mg  1,000 mg Oral Q6H Poggi, Excell Seltzer, MD   1,000 mg at 05/28/18 1203  . [START ON 05/29/2018] acetaminophen (TYLENOL) tablet 325-650 mg  325-650 mg Oral Q6H PRN Poggi, Excell Seltzer, MD      . albuterol (PROVENTIL) (2.5 MG/3ML) 0.083% nebulizer solution 2.5 mg  2.5 mg Nebulization Q6H PRN Poggi, Excell Seltzer, MD      . amitriptyline (ELAVIL) tablet 25 mg  25 mg Oral QHS Poggi, Excell Seltzer, MD      . aspirin EC tablet 81 mg  81 mg Oral BID Poggi, Excell Seltzer, MD      . bisacodyl (DULCOLAX) suppository 10 mg  10 mg Rectal Daily PRN Poggi, Excell Seltzer, MD      . calcium-vitamin D (OSCAL WITH D) 500-200 MG-UNIT per tablet 1 tablet  1 tablet Oral BID Poggi, Excell Seltzer, MD      . ceFAZolin (ANCEF) 3 g in dextrose 5 % 50 mL IVPB  3 g Intravenous Q6H Poggi, Excell Seltzer, MD      . diphenhydrAMINE (BENADRYL) 12.5 MG/5ML elixir 12.5-25 mg  12.5-25 mg Oral Q4H PRN Poggi,  Excell SeltzerJohn J, MD      . docusate sodium (COLACE) capsule 100 mg  100 mg Oral BID Christena FlakePoggi, John J, MD   100 mg at 05/28/18 1203  . [START ON 05/29/2018] enoxaparin (LOVENOX) injection 40 mg  40 mg Subcutaneous Q24H Poggi, Excell SeltzerJohn J, MD      . Melene Muller[START ON 05/29/2018] ferrous sulfate tablet 325 mg  325 mg Oral Q breakfast Poggi, Excell SeltzerJohn J, MD      . gabapentin (NEURONTIN) tablet 600 mg  600 mg Oral BID Poggi, Excell SeltzerJohn J, MD      . lisinopril (PRINIVIL,ZESTRIL) tablet 20 mg  20 mg Oral Daily Poggi, Excell SeltzerJohn J, MD   20 mg at 05/28/18 1203   And  . hydrochlorothiazide (MICROZIDE) capsule 12.5 mg  12.5 mg Oral Daily Poggi, Excell SeltzerJohn J, MD   12.5 mg at 05/28/18 1204  . HYDROmorphone (DILAUDID) injection 0.5-1 mg  0.5-1 mg Intravenous Q4H PRN Poggi, Excell SeltzerJohn J, MD      . ketorolac (TORADOL) 15 MG/ML injection 15 mg   15 mg Intravenous Q6H Poggi, Excell SeltzerJohn J, MD   15 mg at 05/28/18 1204  . ketorolac (TORADOL) 30 MG/ML injection 30 mg  30 mg Intravenous Once Poggi, Excell SeltzerJohn J, MD      . lactated ringers infusion   Intravenous Continuous Poggi, Excell SeltzerJohn J, MD 50 mL/hr at 05/28/18 0650    . lurasidone (LATUDA) tablet 80 mg  80 mg Oral QHS Poggi, Excell SeltzerJohn J, MD      . magnesium hydroxide (MILK OF MAGNESIA) suspension 30 mL  30 mL Oral Daily PRN Poggi, Excell SeltzerJohn J, MD      . metoCLOPramide (REGLAN) tablet 5-10 mg  5-10 mg Oral Q8H PRN Poggi, Excell SeltzerJohn J, MD       Or  . metoCLOPramide (REGLAN) injection 5-10 mg  5-10 mg Intravenous Q8H PRN Poggi, Excell SeltzerJohn J, MD      . ondansetron (ZOFRAN) tablet 4 mg  4 mg Oral Q6H PRN Poggi, Excell SeltzerJohn J, MD       Or  . ondansetron (ZOFRAN) injection 4 mg  4 mg Intravenous Q6H PRN Poggi, Excell SeltzerJohn J, MD      . oxyCODONE (Oxy IR/ROXICODONE) immediate release tablet 5-10 mg  5-10 mg Oral Q4H PRN Poggi, Excell SeltzerJohn J, MD      . Melene Muller[START ON 05/29/2018] pantoprazole (PROTONIX) EC tablet 40 mg  40 mg Oral Daily Poggi, Excell SeltzerJohn J, MD      . pravastatin (PRAVACHOL) tablet 10 mg  10 mg Oral QHS Poggi, Excell SeltzerJohn J, MD      . Melene Muller[START ON 05/29/2018] propranolol ER (INDERAL LA) 24 hr capsule 80 mg  80 mg Oral Daily Poggi, Excell SeltzerJohn J, MD      . rOPINIRole (REQUIP) tablet 4 mg  4 mg Oral QHS Poggi, Excell SeltzerJohn J, MD      . sodium phosphate (FLEET) 7-19 GM/118ML enema 1 enema  1 enema Rectal Once PRN Poggi, Excell SeltzerJohn J, MD      . traMADol Janean Sark(ULTRAM) tablet 50 mg  50 mg Oral Q6H PRN Poggi, Excell SeltzerJohn J, MD      . Melene Muller[START ON 05/29/2018] venlafaxine XR (EFFEXOR-XR) 24 hr capsule 150 mg  150 mg Oral Q breakfast Poggi, Excell SeltzerJohn J, MD         Discharge Medications: Please see discharge summary for a list of discharge medications.  Relevant Imaging Results:  Relevant Lab Results:   Additional Information (SSN: [id number])  Izaha Shughart, Darleen CrockerBailey M, LCSW

## 2018-05-28 NOTE — Anesthesia Post-op Follow-up Note (Signed)
Anesthesia QCDR form completed.        

## 2018-05-28 NOTE — Discharge Summary (Signed)
Physician Discharge Summary  Patient ID: Paula Hammond MRN: [id number] DOB/AGE: Apr 20, 1956 62 y.o.  Admit date: 05/28/2018 Discharge date: 05/30/2018  Admission Diagnoses:  DEGENERATIVE TEAR OF MEDIAL MENISCUS OF RIGHT KNEE Right knee osteoarthritis  Discharge Diagnoses: Patient Active Problem List   Diagnosis Date Noted  . Status post total knee replacement using cement, right 05/28/2018  . Status post total left knee replacement using cement 11/21/2015  Right knee osteoarthritis  Past Medical History:  Diagnosis Date  . Anxiety   . Arthritis   . Bipolar disorder (HCC)   . Depression   . GERD (gastroesophageal reflux disease)   . History of kidney stones   . Hypercholesteremia   . Hypertension   . Kidney stones   . PONV (postoperative nausea and vomiting) 1991   brain surgery done in [place]  . Restless leg   . Seizures (HCC)    Transfusion: None.   Consultants (if any):   Discharged Condition: Improved  Hospital Course: Paula Hammond is an 62 y.o. female who was admitted 05/28/2018 with a diagnosis of right knee osteoarthritis and went to the operating room on 05/28/2018 and underwent the above named procedures.    Surgeries: Procedure(s): TOTAL KNEE ARTHROPLASTY on 05/28/2018 Patient tolerated the surgery well. Taken to PACU where she was stabilized and then transferred to the orthopedic floor.  Started on Lovenox 40mg  q 24 hrs. Foot pumps applied bilaterally at 80 mm. Heels elevated on bed with rolled towels. No evidence of DVT. Negative Homan. Physical therapy started on day #1 for gait training and transfer. OT started day #1 for ADL and assisted devices.  Patient's IV was removed on POD1 and Foley was removed shortly after surgery.  Implants: Right TKA using all-cemented [employer] Vanguard system with a 67.5 mm mm PCR femur, a 75 mm tibial tray with a 12 mm AS E-poly insert, and a 34 x 8.5 mm all-poly 3-pegged domed patella.  She was given perioperative  antibiotics:  Anti-infectives (From admission, onward)   Start     Dose/Rate Route Frequency Ordered Stop   05/28/18 1400  ceFAZolin (ANCEF) 3 g in dextrose 5 % 50 mL IVPB     3 g 100 mL/hr over 30 Minutes Intravenous Every 6 hours 05/28/18 1108 05/29/18 0302   05/27/18 2230  ceFAZolin (ANCEF) 3 g in dextrose 5 % 50 mL IVPB     3 g 100 mL/hr over 30 Minutes Intravenous  Once 05/27/18 2227 05/28/18 0745    .  She was given sequential compression devices, early ambulation, and lovenox for DVT prophylaxis.  She benefited maximally from the hospital stay and there were no complications.    Recent vital signs:  Vitals:   05/29/18 1709 05/29/18 2350  BP: (!) 146/85 (!) 162/91  Pulse: (!) 59 (!) 53  Resp:  19  Temp: 97.9 F (36.6 C) 99 F (37.2 C)  SpO2: 96% 98%   Recent laboratory studies:  Lab Results  Component Value Date   HGB 10.0 (L) 05/29/2018   HGB 12.4 05/20/2018   HGB 11.0 (L) 11/23/2015   Lab Results  Component Value Date   WBC 10.6 05/29/2018   PLT 255 05/29/2018   Lab Results  Component Value Date   INR 1.07 05/20/2018   Lab Results  Component Value Date   NA 131 (L) 05/29/2018   K 4.2 05/29/2018   CL 101 05/29/2018   CO2 24 05/29/2018   BUN 13 05/29/2018   CREATININE 0.85 05/29/2018  GLUCOSE 167 (H) 05/29/2018    Discharge Medications:   Allergies as of 05/30/2018      Reactions   Mellaril [thioridazine] Hives, Itching      Medication List    TAKE these medications   albuterol 108 (90 Base) MCG/ACT inhaler Commonly known as:  PROVENTIL HFA;VENTOLIN HFA Inhale 1-2 puffs into the lungs every 6 (six) hours as needed for wheezing or shortness of breath.   amitriptyline 25 MG tablet Commonly known as:  ELAVIL Take 25 mg by mouth at bedtime.   aspirin EC 81 MG tablet Take 81 mg by mouth 2 (two) times daily.   CALCIUM 600 + D PO Take 1 tablet by mouth 2 (two) times daily.   enoxaparin 40 MG/0.4ML injection Commonly known as:   LOVENOX Inject 0.4 mLs (40 mg total) into the skin every 12 (twelve) hours.   ferrous sulfate 325 (65 FE) MG tablet Take 325 mg by mouth daily with breakfast.   gabapentin 600 MG tablet Commonly known as:  NEURONTIN Take 600 mg by mouth 2 (two) times daily. May repeat a 3rd dose if needed.   LATUDA 20 MG Tabs tablet Generic drug:  lurasidone Take 20 mg by mouth at bedtime.   LATUDA 60 MG Tabs Generic drug:  Lurasidone HCl Take 60 mg by mouth at bedtime.   lisinopril-hydrochlorothiazide 20-12.5 MG tablet Commonly known as:  PRINZIDE,ZESTORETIC Take 1 tablet by mouth daily.   omeprazole 20 MG capsule Commonly known as:  PRILOSEC Take 20 mg by mouth daily before breakfast.   oxyCODONE 5 MG immediate release tablet Commonly known as:  Oxy IR/ROXICODONE Take 1-2 tablets (5-10 mg total) by mouth every 4 (four) hours as needed for moderate pain (pain score 4-6).   pravastatin 10 MG tablet Commonly known as:  PRAVACHOL Take 10 mg by mouth at bedtime.   propranolol ER 80 MG 24 hr capsule Commonly known as:  INDERAL LA Take 80 mg by mouth daily.   rOPINIRole 4 MG tablet Commonly known as:  REQUIP Take 4 mg by mouth at bedtime.   traMADol 50 MG tablet Commonly known as:  ULTRAM Take 1 tablet (50 mg total) by mouth every 6 (six) hours as needed for moderate pain.   venlafaxine XR 150 MG 24 hr capsule Commonly known as:  EFFEXOR-XR Take 150 mg by mouth daily with breakfast.            Durable Medical Equipment  (From admission, onward)        Start     Ordered   05/28/18 1109  DME Meegan rolling  Once    Question:  Patient needs a [place] to treat with the following condition  Answer:  Status post total knee replacement using cement, right   05/28/18 1108   05/28/18 1109  DME Bedside commode  Once    Question:  Patient needs a bedside commode to treat with the following condition  Answer:  Status post total knee replacement using cement, right   05/28/18 1108    05/28/18 1109  DME 3 n 1  Once     05/28/18 1108      Diagnostic Studies: Dg Chest 2 View  Result Date: 05/20/2018 CLINICAL DATA:  Preoperative evaluation.  No chest complaints. EXAM: CHEST - 2 VIEW COMPARISON:  None. FINDINGS: Cardiac contours upper limits of normal. Bilateral mid lower lung heterogeneous opacities. No pleural effusion or pneumothorax. Thoracic spine degenerative changes. Cholecystectomy clips. IMPRESSION: Heterogeneous opacities lung bases bilaterally may represent atelectasis  or scarring. Mild cardiomegaly. Electronically Signed   By: Annia Beltrew  Davis M.D.   On: 05/20/2018 22:03   Dg Knee Right Port  Result Date: 05/28/2018 CLINICAL DATA:  Postop knee replacement EXAM: PORTABLE RIGHT KNEE - 1-2 VIEW COMPARISON:  MRI 03/20/2018 FINDINGS: Changes of right knee replacement. No hardware or bony complicating feature. IMPRESSION: Right knee replacement.  No complicating feature. Electronically Signed   By: Charlett NoseKevin  Dover M.D.   On: 05/28/2018 10:33   Disposition: Discharge disposition: 01-Home or Self Care       Follow-up Information    Anson OregonMcGhee, James Lance, PA-C Follow up in 14 day(s).   Specialty:  Physician Assistant Contact information: [address] [facility] [place] [id number] [phone]          Signed: Wendall StadeMUNDY, Sweetie Giebler PA-C 05/30/2018, 6:48 AM

## 2018-05-28 NOTE — Transfer of Care (Signed)
Immediate Anesthesia Transfer of Care Note  Patient: Paula Hammond  Procedure(s) Performed: TOTAL KNEE ARTHROPLASTY (Right Knee)  Patient Location: PACU  Anesthesia Type:Spinal  Level of Consciousness: awake, alert , oriented and patient cooperative  Airway & Oxygen Therapy: Patient Spontanous Breathing  Post-op Assessment: Report given to RN and Post -op Vital signs reviewed and stable  Post vital signs: Reviewed and stable  Last Vitals:  Vitals Value Taken Time  BP 144/82 05/28/2018 10:03 AM  Temp    Pulse 59 05/28/2018 10:05 AM  Resp 17 05/28/2018 10:05 AM  SpO2 100 % 05/28/2018 10:05 AM  Vitals shown include unvalidated device data.  Last Pain:  Vitals:   05/28/18 0622  TempSrc: Tympanic  PainSc: 0-No pain         Complications: No apparent anesthesia complications

## 2018-05-28 NOTE — H&P (Signed)
Paper H&P to be scanned into permanent record. H&P reviewed and patient re-examined. No changes. 

## 2018-05-28 NOTE — Progress Notes (Signed)
Chaplains responded to an OR for an AD. Chaplains educated patient and sister about HCPOA and LW. Patient will review and call when ready to complete.    05/28/18 1400  Clinical Encounter Type  Visited With Patient  Visit Type Initial  Referral From Physician  Spiritual Encounters  Spiritual Needs Brochure

## 2018-05-28 NOTE — Anesthesia Procedure Notes (Addendum)
Spinal  Patient location during procedure: OR Start time: 05/28/2018 7:30 AM End time: 05/28/2018 7:35 AM Staffing Anesthesiologist: Lenard SimmerKarenz, Andrew, MD Resident/CRNA: Dava NajjarFrazier, Tayia Stonesifer, CRNA Performed: resident/CRNA  Preanesthetic Checklist Completed: patient identified, site marked, surgical consent, pre-op evaluation, timeout performed, IV checked, risks and benefits discussed and monitors and equipment checked Spinal Block Patient position: sitting Prep: Betadine Patient monitoring: heart rate, continuous pulse ox and blood pressure Approach: midline Location: L3-4 Injection technique: single-shot Needle Needle type: Quincke  Needle gauge: 22 G Needle length: 12.7 cm Assessment Sensory level: T6

## 2018-05-28 NOTE — Op Note (Signed)
05/28/2018  10:00 AM  Patient:   Paula Hammond  Pre-Op Diagnosis:   Degenerative joint disease, right knee.  Post-Op Diagnosis:   Same  Procedure:   Right TKA using all-cemented Biomet Vanguard system with a 67.5 mm mm PCR femur, a 75 mm tibial tray with a 12 mm AS E-poly insert, and a 34 x 8.5 mm all-poly 3-pegged domed patella.  Surgeon:   Maryagnes AmosJ. Jeffrey Poggi, MD  Assistant:   Horris LatinoLance McGhee, PA-C   Anesthesia:   Spinal  Findings:   As above  Complications:   None  EBL:   100 cc  Fluids:   700 cc crystalloid  UOP:   500 cc  TT:   70 minutes at 300 mmHg  Drains:   None  Closure:   Staples  Implants:   As above  Brief Clinical Note:   The patient is a 62 year old female with a long history of progressively worsening right knee pain. The patient's symptoms have progressed despite medications, activity modification, injections, etc. The patient's history and examination were consistent with advanced degenerative joint disease of the right knee confirmed by plain radiographs. The patient presents at this time for a right total knee arthroplasty.  Procedure:   The patient was brought into the operating room. After adequate spinal anesthesia was obtained, the patient was lain in the supine position. A Foley catheter was placed by the nurse before the right lower extremity was prepped with ChloraPrep solution and draped sterilely. Preoperative antibiotics were administered. After verifying the proper laterality with a surgical timeout, the limb was exsanguinated with an Esmarch and the tourniquet inflated to 300 mmHg. A standard anterior approach to the knee was made through an approximately 7 inch incision. The incision was carried down through the subcutaneous tissues to expose superficial retinaculum. This was split the length of the incision and the medial flap elevated sufficiently to expose the medial retinaculum. The medial retinaculum was incised, leaving a 3-4 mm cuff of tissue on  the patella. This was extended distally along the medial border of the patellar tendon and proximally through the medial third of the quadriceps tendon. A subtotal fat pad excision was performed before the soft tissues were elevated off the anteromedial and anterolateral aspects of the proximal tibia to the level of the collateral ligaments. The anterior portions of the medial and lateral menisci were removed, as was the anterior cruciate ligament. With the knee flexed to 90, the external tibial guide was positioned and the appropriate proximal tibial cut made. This piece was taken to the back table where it was measured and found to be optimally replicated by a 75 mm component.  Attention was directed to the distal femur. The intramedullary canal was accessed through a 3/8" drill hole. The intramedullary guide was inserted and position in order to obtain a neutral flexion gap. The intercondylar block was positioned with care taken to avoid notching the anterior cortex of the femur. The appropriate cut was made. Next, the distal cutting block was placed at 6 of valgus alignment. Using the 9 mm slot, the distal cut was made. The distal femur was measured and found to be optimally replicated by the 67.5 mm component. The 67.5 mm 4-in-1 cutting block was positioned and first the posterior, then the posterior chamfer, the anterior chamfer, femoral and intercondylar cuts were made. At this point, the posterior portions medial and lateral menisci were removed. A trial reduction was performed using the appropriate femoral and tibial components with the  10 mm and 12 mm inserts. The 12 mm insert demonstrated excellent stability to varus and valgus stressing both in flexion and extension while permitting full extension. Patella tracking was assessed and found to be excellent. Therefore, the tibial guide position was marked on the proximal tibia. The patella thickness was measured and found to be 19 mm. Therefore, the  appropriate cut was made. The patellar surface was measured and found to be optimally replicated by the 34 mm component. The three peg holes were drilled in place before the trial button was inserted. Patella tracking was assessed and found to be excellent, passing the "no thumb test". The lug holes were drilled into the distal femur before the trial component was removed, leaving only the tibial tray. The keel was then created using the appropriate tower, reamer, and punch.  The bony surfaces were prepared for cementing by irrigating thoroughly with bacitracin saline solution. A bone plug was fashioned from some of the bone that had been removed previously and used to plug the distal femoral canal. In addition, 20 cc of Exparel diluted out to 60 cc with normal saline and 30 cc of 0.5% Sensorcaine were injected into the postero-medial and postero-lateral aspects of the knee, the medial and lateral gutter regions, and the peri-incisional tissues to help with postoperative analgesia. Meanwhile, the cement was being mixed on the back table. When it was ready, the tibial tray was cemented in first. The excess cement was removed using Personal assistant. Next, the femoral component was impacted into place. Again, the excess cement was removed using Personal assistant. The 12 mm trial insert was positioned and the knee brought into extension while the cement hardened. Finally, the patella was cemented into place and secured using the patellar clamp. Again, the excess cement was removed using Personal assistant. Once the cement had hardened, the knee was placed through a range of motion with the findings as described above. Therefore, the trial insert was removed and, after verifying that no cement had been retained posteriorly, the permanent 12 mm AS E-polyethylene insert was positioned and secured using the appropriate key locking mechanism. Again the knee was placed through a range of motion with the findings as described  above.  The wound was copiously irrigated with bacitracin saline solution using the jet lavage system before the quadriceps tendon and retinacular layer were reapproximated using #0 Vicryl interrupted sutures. The superficial retinacular layer also was closed using a running #0 Vicryl suture. A total of 10 cc of transexemic acid (TXA) was injected intra-articularly before the subcutaneous tissues were closed in several layers using 2-0 Vicryl interrupted sutures. The skin was closed using staples. A sterile honeycomb dressing was applied to the skin before the leg was wrapped with an Ace wrap to accommodate the polar pack. The patient was then awakened and returned to the recovery room in satisfactory condition after tolerating the procedure well.

## 2018-05-29 ENCOUNTER — Encounter: Payer: Self-pay | Admitting: Surgery

## 2018-05-29 LAB — CBC WITH DIFFERENTIAL/PLATELET
Basophils Absolute: 0 10*3/uL (ref 0–0.1)
Basophils Relative: 0 %
Eosinophils Absolute: 0 10*3/uL (ref 0–0.7)
Eosinophils Relative: 0 %
HCT: 29 % — ABNORMAL LOW (ref 35.0–47.0)
Hemoglobin: 10 g/dL — ABNORMAL LOW (ref 12.0–16.0)
LYMPHS ABS: 0.8 10*3/uL — AB (ref 1.0–3.6)
LYMPHS PCT: 7 %
MCH: 31.9 pg (ref 26.0–34.0)
MCHC: 34.5 g/dL (ref 32.0–36.0)
MCV: 92.6 fL (ref 80.0–100.0)
MONOS PCT: 4 %
Monocytes Absolute: 0.4 10*3/uL (ref 0.2–0.9)
Neutro Abs: 9.3 10*3/uL — ABNORMAL HIGH (ref 1.4–6.5)
Neutrophils Relative %: 89 %
PLATELETS: 255 10*3/uL (ref 150–440)
RBC: 3.14 MIL/uL — AB (ref 3.80–5.20)
RDW: 13.4 % (ref 11.5–14.5)
WBC: 10.6 10*3/uL (ref 3.6–11.0)

## 2018-05-29 LAB — BASIC METABOLIC PANEL
Anion gap: 6 (ref 5–15)
BUN: 13 mg/dL (ref 6–20)
CHLORIDE: 101 mmol/L (ref 101–111)
CO2: 24 mmol/L (ref 22–32)
CREATININE: 0.85 mg/dL (ref 0.44–1.00)
Calcium: 8.3 mg/dL — ABNORMAL LOW (ref 8.9–10.3)
GFR calc Af Amer: >60 mL/min (ref >60)
Glucose, Bld: 167 mg/dL — ABNORMAL HIGH (ref 65–99)
POTASSIUM: 4.2 mmol/L (ref 3.5–5.1)
Sodium: 131 mmol/L — ABNORMAL LOW (ref 135–145)

## 2018-05-29 NOTE — Care Management Note (Addendum)
Case Management Note  Patient Details  Name: Paula Hammond MRN: [id number] Date of Birth: 1956-01-24  Subjective/Objective:   Met with patient at bedside to discuss discharge planning. Patient lives with her son. He grand daughters are in town and will be helping her for a while. She will need a [place]. Ordered from [place] with [facility]. Offered a list of home care providers. [facility] chose [facility], they had worked with her dad. Referral to [facility] with [facility] for HHPT. [facility]acy: [facility]- [phone]. Called Lovenox 40 mg # 14, no refills.         Patient address; [place], [place] [address]          Action/Plan: [facility] for HHPT and [place].   Expected Discharge Date:                  Expected Discharge Plan:  [place]  In-House Referral:     Discharge planning Services  CM Consult  Post Acute Care Choice:  Durable Medical Equipment, Home Health Choice offered to:  Patient  DME Arranged:  [place] rolling DME Agency:  [place]:  PT [facility] Agency:  [place]  Status of Service:  In process, will continue to follow  If discussed at Long Length of Stay Meetings, dates discussed:    Additional Comments:  Jolly Mango, RN 05/29/2018, 11:31 AM

## 2018-05-29 NOTE — Progress Notes (Signed)
  Subjective: 1 Day Post-Op Procedure(s) (LRB): TOTAL KNEE ARTHROPLASTY (Right) Patient reports pain as mild.   Patient seen in rounds with Dr. Joice LoftsPoggi. Patient is well, and has had no acute complaints or problems Plan is to go Home after hospital stay. Negative for chest pain and shortness of breath Fever: no Gastrointestinal: Negative for nausea and vomiting  Objective: Vital signs in last 24 hours: Temp:  [96.8 F (36 C)-99.2 F (37.3 C)] 98.4 F (36.9 C) (06/07 0410) Pulse Rate:  [52-70] 57 (06/07 0410) Resp:  [11-19] 19 (06/07 0410) BP: (136-166)/(65-89) 141/69 (06/07 0410) SpO2:  [95 %-100 %] 98 % (06/07 0410)  Intake/Output from previous day:  Intake/Output Summary (Last 24 hours) at 05/29/2018 0709 Last data filed at 05/29/2018 0417 Gross per 24 hour  Intake 3361.66 ml  Output 2900 ml  Net 461.66 ml    Intake/Output this shift: No intake/output data recorded.  Labs: Recent Labs    05/29/18 0454  HGB 10.0*   Recent Labs    05/29/18 0454  WBC 10.6  RBC 3.14*  HCT 29.0*  PLT 255   Recent Labs    05/29/18 0454  NA 131*  K 4.2  CL 101  CO2 24  BUN 13  CREATININE 0.85  GLUCOSE 167*  CALCIUM 8.3*   No results for input(s): LABPT, INR in the last 72 hours.   EXAM General - Patient is Alert and Oriented Extremity - Sensation intact distally Dorsiflexion/Plantar flexion intact No cellulitis present Compartment soft Dressing/Incision - clean, dry, no drainage Motor Function - intact, moving foot and toes well on exam.   Past Medical History:  Diagnosis Date  . Anxiety   . Arthritis   . Bipolar disorder (HCC)   . Depression   . GERD (gastroesophageal reflux disease)   . History of kidney stones   . Hypercholesteremia   . Hypertension   . Kidney stones   . PONV (postoperative nausea and vomiting) 1991   [place] in [place]  . Restless leg   . Seizures (HCC)     Assessment/Plan: 1 Day Post-Op Procedure(s) (LRB): TOTAL KNEE  ARTHROPLASTY (Right) Active Problems:   Status post total knee replacement using cement, right  Estimated body mass index is 50.64 kg/m as calculated from the following:   Height as of this encounter: 5\' 4"  (1.626 m).   Weight as of this encounter: 133.8 kg (295 lb). [facility] diet Up with therapy D/C IV fluids Discharge home with home health planning for tomorrow  DVT Prophylaxis - Lovenox, Foot Pumps and TED hose Weight-Bearing as tolerated to right leg  Dedra Skeensodd Emmabelle Fear, PA-C Orthopaedic Surgery 05/29/2018, 7:09 AM

## 2018-05-29 NOTE — Progress Notes (Signed)
Physical Therapy Treatment Patient Details Name: Paula LionsGloria J Pai MRN: [id number] DOB: 04/09/1956 Today's Date: 05/29/2018    History of Present Illness Pt admitted for R TKR by Dr. Joice LoftsPoggi. L TKR 2-3 years ago    PT Comments    Pt continues to progress toward her goals. She requires only CGA to transfer sit<>stand using BRW. Pt requires minimal cuing to perform HEP. Pts DF strength 5/5. Pt still lacks light touch sensation over a small area on the distal anterior/lateral R LE, unclear if this is due to removal of polar care unit at beginning of session. Pts ROM is improving, AAROM 3-95. Pts strength is grossly improving evident through her decreased need for assistance during functional tasks. Pt demonstrates improving function and ability to function safely. PTs recommendation continues to be home with home health PT and 24/7 supervision.   Follow Up Recommendations  Home health PT;Supervision/Assistance - 24 hour     Equipment Recommendations  Rolling Kugel with 5" wheels    Recommendations for Other Services       Precautions / Restrictions Precautions Precautions: Knee;Fall Precaution Booklet Issued: Yes (comment) Precaution Comments: (HEP packet) Restrictions Weight Bearing Restrictions: Yes RLE Weight Bearing: Weight bearing as tolerated    Mobility  Bed Mobility               General bed mobility comments: pt received up in chair  Transfers Overall transfer level: Needs assistance Equipment used: Rolling Bracey (2 wheeled) Transfers: Sit to/from Stand Sit to Stand: Min guard         General transfer comment: pt able to sit<>stand with CGA and verbal cuing for safety  Ambulation/Gait Ambulation/Gait assistance: Min guard Ambulation Distance (Feet): 140 Feet Assistive device: Rolling Schnack[place] (2 wheeled) Gait Pattern/deviations: Step-through pattern;Trunk flexed     General Gait Details: patient demonstrates a step through gait pattern with CGA and a chair  follow. required verbal quing for upright posture and RW use. able to [facility] safely with cuing to look ahead instead of down at her feet. pt demonstrated good endurance and did not require any rest breaks during ambulation.   Stairs             Wheelchair Mobility    Modified Rankin (Stroke Patients Only)       Balance                                            Cognition Arousal/Alertness: Awake/alert Behavior During Therapy: WFL for tasks assessed/performed Overall Cognitive Status: Within Functional Limits for tasks assessed                                        Exercises Total Joint Exercises Goniometric ROM: R knee AAROM 3-95 Other Exercises Other Exercises: ther-ex in long sitting to R LE x15 reps ankle pumps, quad sets; x15 reps abd, SLR, SAQ. seated ther ex heel slides x10. requiring only CGA and min assist. HEP packet given and reviewed.    General Comments        Pertinent Vitals/Pain Pain Assessment: 0-10 Pain Score: 0-No pain Pain Location: R knee Pain Intervention(s): Limited activity within patient's tolerance;Monitored during session;Ice applied    Home Living  Prior Function            PT Goals (current goals can now be found in the care plan section) Acute Rehab PT Goals Patient Stated Goal: "I want to go home" PT Goal Formulation: With patient Time For Goal Achievement: 06/11/18 Potential to Achieve Goals: Good Progress towards PT goals: Progressing toward goals    Frequency    BID      PT Plan Current plan remains appropriate    Co-evaluation              AM-PAC PT "6 Clicks" Daily Activity  Outcome Measure  Difficulty turning over in bed (including adjusting bedclothes, sheets and blankets)?: A Little Difficulty moving from lying on back to sitting on the side of the bed? : A Little Difficulty sitting down on and standing up from a chair  with arms (e.g., wheelchair, bedside commode, etc,.)?: A Little Help needed moving to and from a bed to chair (including a wheelchair)?: A Little Help needed walking in hospital room?: A Little Help needed climbing 3-5 steps with a railing? : A Lot 6 Click Score: 17    End of Session Equipment Utilized During Treatment: Gait belt Activity Tolerance: Patient tolerated treatment well Patient left: in chair;with chair alarm set;with call bell/phone within reach;with SCD's reapplied   PT Visit Diagnosis: Unsteadiness on feet (R26.81);Muscle weakness (generalized) (M62.81);Pain Pain - Right/Left: Right Pain - part of body: Knee     Time: 1478-2956 PT Time Calculation (min) (ACUTE ONLY): 33 min  Charges:  $Gait Training: 8-22 mins $Therapeutic Exercise: 8-22 mins                    G Codes:       Undrea Shipes, SPT    Neeva Trew 05/29/2018, 11:57 AM

## 2018-05-29 NOTE — Progress Notes (Addendum)
Physical Therapy Treatment Patient Details Name: Paula Hammond MRN: [id number] DOB: 01-18-1956 Today's Date: 05/29/2018    History of Present Illness Pt admitted for R TKR by Dr. Joice Lofts. L TKR 2-3 years ago    PT Comments    Pt continues to progress toward her goals evident through her increased ambulation distance, decreased need for assistance with bed mobility and transfers, and increasing independence with HEP. Pt able to ambulate 200' with CGA and minimal cuing. Pt demonstrates good endurance and reports being able to toilet with minimal assistance from nursing staff. PT will continue to work with pt BID while admitted to hospital, PT plans to practice stair training at next visit. Discharge recommendation continues to be home with 24/7 supervision.        Follow Up Recommendations  Home health PT;Supervision/Assistance - 24 hour     Equipment Recommendations  Other (comment)(has RW)    Recommendations for Other Services       Precautions / Restrictions Precautions Precautions: Knee;Fall Precaution Booklet Issued: Yes (comment) Precaution Comments: (HEP packet) Restrictions Weight Bearing Restrictions: Yes RLE Weight Bearing: Weight bearing as tolerated    Mobility  Bed Mobility Overal bed mobility: Needs Assistance Bed Mobility: Sit to Supine     Supine to sit: Supervision     General bed mobility comments: pt able to independently sit<>supine demonstrating increased safety.  Transfers Overall transfer level: Needs assistance Equipment used: Rolling [place] (2 wheeled) Transfers: Sit to/from Stand Sit to Stand: Min guard         General transfer comment: pt able to sit<>stand with CGA and verbal cuing for safety  Ambulation/Gait Ambulation/Gait assistance: Min guard Ambulation Distance (Feet): 200 Feet Assistive device: Rolling Ige (2 wheeled) Gait Pattern/deviations: Step-through pattern;Trunk flexed     General Gait Details: patient demonstrates  a step through gait pattern with CGA. required verbal quing for upright posture and RW use. able to [facility] enviornment safely with cuing to look ahead instead of down at her feet. pt demonstrated good endurance and did not require any rest breaks during ambulation.   Stairs             Wheelchair Mobility    Modified Rankin (Stroke Patients Only)       Balance                                            Cognition Arousal/Alertness: Awake/alert Behavior During Therapy: WFL for tasks assessed/performed Overall Cognitive Status: Within Functional Limits for tasks assessed                                        Exercises Total Joint Exercises Goniometric ROM: R knee AAROM 3-95 Other Exercises Other Exercises: seated ther-ex x15 with minimal cuing for technique and safety; heel slides, supine ankle pumps, quad sets, glut sets, abduction, and SLR. Pt also cued in using incentive spirometer x10 due to light wheezing that pt states has been occuring occassionally while admitted    General Comments        Pertinent Vitals/Pain Pain Assessment: 0-10 Pain Score: 4  Pain Location: R knee Pain Descriptors / Indicators: Operative site guarding Pain Intervention(s): Limited activity within patient's tolerance;Monitored during session;Ice applied    Home Living  Prior Function            PT Goals (current goals can now be found in the care plan section) Acute Rehab PT Goals Patient Stated Goal: "I want to go home" PT Goal Formulation: With patient Time For Goal Achievement: 06/11/18 Potential to Achieve Goals: Good Progress towards PT goals: Progressing toward goals    Frequency    BID      PT Plan Current plan remains appropriate    Co-evaluation              AM-PAC PT "6 Clicks" Daily Activity  Outcome Measure  Difficulty turning over in bed (including adjusting bedclothes, sheets and  blankets)?: A Little Difficulty moving from lying on back to sitting on the side of the bed? : A Little Difficulty sitting down on and standing up from a chair with arms (e.g., wheelchair, bedside commode, etc,.)?: A Little Help needed moving to and from a bed to chair (including a wheelchair)?: A Little Help needed walking in hospital room?: A Little Help needed climbing 3-5 steps with a railing? : A Little 6 Click Score: 18    End of Session Equipment Utilized During Treatment: Gait belt Activity Tolerance: Patient tolerated treatment well Patient left: in bed;with call bell/phone within reach;with bed alarm set;with SCD's reapplied   PT Visit Diagnosis: Unsteadiness on feet (R26.81);Muscle weakness (generalized) (M62.81);Pain Pain - Right/Left: Right Pain - part of body: Knee     Time: [id number] PT Time Calculation (min) (ACUTE ONLY): 30 min  Charges:  $Gait Training: 8-22 mins $Therapeutic Exercise: 8-22 mins                    G Codes:       [employer]an Michaline Hammond, SPT    Thedford Bunton 05/29/2018, 2:33 PM

## 2018-05-29 NOTE — Progress Notes (Signed)
Clinical Social Worker (CSW) received SNF consult. PT is recommending home health. RN case manager aware of above. Please reconsult if future social work needs arise. CSW signing off.   [place] , LCSW [phone] 

## 2018-05-30 LAB — BASIC METABOLIC PANEL
Anion gap: 7 (ref 5–15)
BUN: 15 mg/dL (ref 6–20)
CALCIUM: 8.3 mg/dL — AB (ref 8.9–10.3)
CO2: 27 mmol/L (ref 22–32)
Chloride: 94 mmol/L — ABNORMAL LOW (ref 101–111)
Creatinine, Ser: 0.81 mg/dL (ref 0.44–1.00)
Glucose, Bld: 102 mg/dL — ABNORMAL HIGH (ref 65–99)
POTASSIUM: 4 mmol/L (ref 3.5–5.1)
SODIUM: 128 mmol/L — AB (ref 135–145)

## 2018-05-30 MED ORDER — TRAMADOL HCL 50 MG PO TABS: 50.0000 mg | ORAL_TABLET | Freq: Four times a day (QID) | ORAL | 1 refills | Status: AC | PRN

## 2018-05-30 MED ORDER — ENOXAPARIN SODIUM 40 MG/0.4ML ~~LOC~~ SOLN: 40.0000 mg | Freq: Two times a day (BID) | SUBCUTANEOUS | 0 refills | Status: AC

## 2018-05-30 MED ORDER — OXYCODONE HCL 5 MG PO TABS: 5.0000 mg | ORAL_TABLET | ORAL | 0 refills | Status: AC | PRN

## 2018-05-30 NOTE — Progress Notes (Signed)
Physical Therapy Treatment Patient Details Name: Paula LionsGloria J Wronski MRN: [id number] DOB: 07/01/1956 Today's Date: 05/30/2018    History of Present Illness Pt admitted for R TKR by Dr. Joice LoftsPoggi. L TKR 2-3 years ago    PT Comments    Pt stated she has increased soreness today.  Stated she sat on commode last night without putting her RLE out and experienced increased soreness.  Participated in exercises as described below.  Pt needed increased help today due to general pain/soreness.  Stood with [place] and min assist.  She was able to ambulate around end of bed to recliner with min guard.   Pt was taken to gym by recliner and stair training was completed.  Stair training up/down 4 steps with bilateral rails and min gurad x 2.  No buckling noted.  She was able to continue gait an additional 6060' before being limited by pain. Pt voiced being comfortable with mobility at home.   Follow Up Recommendations  Home health PT;Supervision/Assistance - 24 hour     Equipment Recommendations       Recommendations for Other Services       Precautions / Restrictions Precautions Precautions: Knee;Fall Restrictions Weight Bearing Restrictions: Yes RLE Weight Bearing: Weight bearing as tolerated    Mobility  Bed Mobility Overal bed mobility: Modified Independent Bed Mobility: Supine to Sit     Supine to sit: Supervision        Transfers Overall transfer level: Needs assistance Equipment used: Rolling Deyarmin (2 wheeled) Transfers: Sit to/from Stand Sit to Stand: Min guard         General transfer comment: pt able to sit<>stand with CGA and verbal cuing for safety  Ambulation/Gait Ambulation/Gait assistance: Min guard Ambulation Distance (Feet): 60 Feet Assistive device: Rolling Burzynski (2 wheeled) Gait Pattern/deviations: Step-to pattern;Decreased step length - right;Decreased step length - left;Decreased stance time - right   Gait velocity interpretation: <1.8 ft/sec, indicate of risk for  recurrent falls     Stairs 4 steps with bilateral rails.  No buckling step by step pattern with min guard x 2.           Wheelchair Mobility    Modified Rankin (Stroke Patients Only)       Balance Overall balance assessment: Needs assistance         Standing balance support: Bilateral upper extremity supported Standing balance-Leahy Scale: Fair Standing balance comment: Standing, RW used for BUE support                            Cognition Arousal/Alertness: Awake/alert Behavior During Therapy: WFL for tasks assessed/performed Overall Cognitive Status: Within Functional Limits for tasks assessed                                        Exercises Other Exercises Other Exercises: RLE supine ex - quad sets, SLR, ab/add, heel slides x 10.  LAQ seated x 10.      General Comments        Pertinent Vitals/Pain Pain Assessment: 0-10 Pain Score: 7  Pain Location: R knee Pain Descriptors / Indicators: Operative site guarding;Sore Pain Intervention(s): Limited activity within patient's tolerance;Monitored during session;Premedicated before session;Ice applied    Home Living                      Prior Function  PT Goals (current goals can now be found in the care plan section) Progress towards PT goals: Progressing toward goals    Frequency    BID      PT Plan Current plan remains appropriate    Co-evaluation              AM-PAC PT "6 Clicks" Daily Activity  Outcome Measure  Difficulty turning over in bed (including adjusting bedclothes, sheets and blankets)?: A Little Difficulty moving from lying on back to sitting on the side of the bed? : None Difficulty sitting down on and standing up from a chair with arms (e.g., wheelchair, bedside commode, etc,.)?: A Little Help needed moving to and from a bed to chair (including a wheelchair)?: A Little Help needed walking in hospital room?: A Little Help  needed climbing 3-5 steps with a railing? : A Little 6 Click Score: 19    End of Session Equipment Utilized During Treatment: Gait belt Activity Tolerance: Patient limited by pain Patient left: in chair;with chair alarm set;with call bell/phone within reach   Pain - Right/Left: Right Pain - part of body: Knee     Time: 0925-0953 PT Time Calculation (min) (ACUTE ONLY): 28 min  Charges:  $Gait Training: 8-22 mins $Therapeutic Exercise: 8-22 mins                    G Codes:       Danielle Dess, PTA 05/30/18, 10:13 AM

## 2018-05-30 NOTE — Care Management Note (Signed)
Case Management Note  Patient Details  Name: Bryon LionsGloria J Hammond MRN: [id number] Date of Birth: 04/05/1956  Subjective/Objective:    Patient to be discharged per MD with orders for HHPT. Previously work up with [facility] per patient choice. [facility] Se[place] previously delivered. RNCM to sign off Buddy DutyJosh Felder Lebeda RN BSN RNCM [phone]                Action/Plan:   Expected Discharge Date:  05/30/18               Expected Discharge Plan:  Home w Home Health Services  In-House Referral:     Discharge planning Services  CM Consult  Post Acute Care Choice:  Durable Medical Equipment, Home Health Choice offered to:  Patient  DME Arranged:  [place] rolling DME Agency:  [facility]  [facility]  Status of Service:  Completed, signed off  If discussed at Long Length of Stay Meetings, dates discussed:    Additional Comments:  Walta Bellville A Halina Asano, RN 05/30/2018, 9:30 AM

## 2018-05-30 NOTE — Progress Notes (Signed)
  Subjective: 2 Days Post-Op Procedure(s) (LRB): TOTAL KNEE ARTHROPLASTY (Right) Patient reports pain as mild.   Patient seen in rounds with Dr. Joice LoftsPoggi. Patient is well, and has had no acute complaints or problems Plan is to go Home after hospital stay. Negative for chest pain and shortness of breath Fever: no Gastrointestinal: Negative for nausea and vomiting  Objective: Vital signs in last 24 hours: Temp:  [97.9 F (36.6 C)-99 F (37.2 C)] 99 F (37.2 C) (06/07 2350) Pulse Rate:  [53-62] 53 (06/07 2350) Resp:  [18-19] 19 (06/07 2350) BP: (139-162)/(67-91) 162/91 (06/07 2350) SpO2:  [96 %-98 %] 98 % (06/07 2350)  Intake/Output from previous day:  Intake/Output Summary (Last 24 hours) at 05/30/2018 0644 Last data filed at 05/30/2018 0432 Gross per 24 hour  Intake 1480 ml  Output -  Net 1480 ml    Intake/Output this shift: Total I/O In: 640 [P.O.:640] Out: -   Labs: Recent Labs    05/29/18 0454  HGB 10.0*   Recent Labs    05/29/18 0454  WBC 10.6  RBC 3.14*  HCT 29.0*  PLT 255   Recent Labs    05/29/18 0454  NA 131*  K 4.2  CL 101  CO2 24  BUN 13  CREATININE 0.85  GLUCOSE 167*  CALCIUM 8.3*   No results for input(s): LABPT, INR in the last 72 hours.   EXAM General - Patient is Alert and Oriented Extremity - Sensation intact distally Dorsiflexion/Plantar flexion intact No cellulitis present Compartment soft Dressing/Incision - clean, dry, no drainage with Ace wrap removed Motor Function - intact, moving foot and toes well on exam.  Able do a straight leg raise.  Past Medical History:  Diagnosis Date  . Anxiety   . Arthritis   . Bipolar disorder (HCC)   . Depression   . GERD (gastroesophageal reflux disease)   . History of kidney stones   . Hypercholesteremia   . Hypertension   . Kidney stones   . PONV (postoperative nausea and vomiting) 1991   [place]  . Restless leg   . Seizures (HCC)     Assessment/Plan: 2  Days Post-Op Procedure(s) (LRB): TOTAL KNEE ARTHROPLASTY (Right) Active Problems:   Status post total knee replacement using cement, right  Estimated body mass index is 50.64 kg/m as calculated from the following:   Height as of this encounter: 5\' 4"  (1.626 m).   Weight as of this encounter: 133.8 kg (295 lb). Advance diet Up with therapy D/C IV fluids Discharge home with home health planning for today  DVT Prophylaxis - Lovenox, Foot Pumps and TED hose Weight-Bearing as tolerated to right leg  Dedra Skeensodd Hall Birchard, PA-C Orthopaedic Surgery 05/30/2018, 6:44 AM

## 2018-05-30 NOTE — Plan of Care (Signed)

## 2018-05-30 NOTE — Anesthesia Postprocedure Evaluation (Signed)
Anesthesia Post Note  Patient: Paula Hammond  Procedure(s) Performed: TOTAL KNEE ARTHROPLASTY (Right Knee)  Anesthesia Type: Spinal Comments: Pt discharged prior to being seen.     Last Vitals:  Vitals:   05/29/18 2350 05/30/18 0803  BP: (!) 162/91 125/70  Pulse: (!) 53 (!) 58  Resp: 19 18  Temp: 37.2 C 37 C  SpO2: 98% 98%    Last Pain:  Vitals:   05/30/18 0913  TempSrc:   PainSc: 5                  Iraida Cragin K

## 2018-05-30 NOTE — Progress Notes (Signed)
Patient discharging home. Instructions and prescriptions given to patient, Verbalized understanding. Polar care and teds on. Iv removed. Son at bedside. Waiting on patient's sister to come transport patient home.

## 2018-06-02 ENCOUNTER — Telehealth: Payer: Self-pay

## 2018-06-02 NOTE — Telephone Encounter (Signed)
EMMI Follow-up: Noted on the report that patient had a concern who to call if condition changes and about discharge papers. Talked with Ms. Mattos and she couldn't find her discharge paperwork but doctor's office had called today so got questions answered.  No needs noted.

## 2018-06-05 ENCOUNTER — Emergency Department: Payer: Medicare Other

## 2018-06-05 ENCOUNTER — Emergency Department
Admission: EM | Admit: 2018-06-05 | Discharge: 2018-06-05 | Payer: Medicare Other | Attending: Emergency Medicine | Admitting: Emergency Medicine

## 2018-06-05 ENCOUNTER — Encounter: Payer: Self-pay | Admitting: Emergency Medicine

## 2018-06-05 DIAGNOSIS — Z7982 Long term (current) use of aspirin: Secondary | ICD-10-CM | POA: Insufficient documentation

## 2018-06-05 DIAGNOSIS — R0602 Shortness of breath: Secondary | ICD-10-CM | POA: Diagnosis not present

## 2018-06-05 DIAGNOSIS — I10 Essential (primary) hypertension: Secondary | ICD-10-CM | POA: Diagnosis not present

## 2018-06-05 DIAGNOSIS — R2241 Localized swelling, mass and lump, right lower limb: Secondary | ICD-10-CM | POA: Insufficient documentation

## 2018-06-05 DIAGNOSIS — Z79899 Other long term (current) drug therapy: Secondary | ICD-10-CM | POA: Insufficient documentation

## 2018-06-05 DIAGNOSIS — M7989 Other specified soft tissue disorders: Secondary | ICD-10-CM

## 2018-06-05 MED ORDER — IOPAMIDOL (ISOVUE-370) INJECTION 76%
100.0000 mL | Freq: Once | INTRAVENOUS | Status: AC | PRN
  Administered 2018-06-05: 100 mL via INTRAVENOUS

## 2018-06-05 MED ORDER — IOPAMIDOL (ISOVUE-370) INJECTION 76%: 75.0000 mL | Freq: Once | INTRAVENOUS | Status: DC | PRN

## 2018-06-05 NOTE — ED Provider Notes (Signed)
[facility] Emergency Department Provider Note  ____________________________________________   First MD Initiated Contact with Patient 06/05/18 1422     (approximate)  I have reviewed the triage vital signs and the nursing notes.   HISTORY  Chief Complaint Leg Swelling   HPI Paula Hammond is a 62 y.o. female sent to the emergency department by her orthopedic surgeon for evaluation of possible pulmonary embolism.  She recently had a right knee replacement and has reported increasing swelling in her right leg for the past several days along with some shortness of breath.  Her shortness of breath is mild to moderate worse with exertion improved with rest.  The swelling in her leg is been gradual onset.  No fevers or chills.  No history of DVT or pulmonary embolism.  Past Medical History:  Diagnosis Date  . Anxiety   . Arthritis   . Bipolar disorder (HCC)   . Depression   . GERD (gastroesophageal reflux disease)   . History of kidney stones   . Hypercholesteremia   . Hypertension   . Kidney stones   . PONV (postoperative nausea and vomiting) 1991   brain [place] done in [place]  . Restless leg   . Seizures [facility])     Patient Active Problem List   Diagnosis Date Noted  . Status post total knee replacement using cement, right 05/28/2018  . Status post total left knee replacement using cement 11/21/2015    Past Surgical History:  Procedure Laterality Date  . BRAIN SURGERY  1991   for seizures  . CESAREAN SECTION     x 2  . CHOLECYSTECTOMY    . JOINT REPLACEMENT    . MIDDLE EAR SURGERY Left 1965   lanced left ear.  . TONSILLECTOMY    . TOTAL KNEE ARTHROPLASTY Left 11/21/2015   Procedure: TOTAL KNEE ARTHROPLASTY;  Surgeon: Christena Flake, MD;  Location: [facility] ORS;  Service: Orthopedics;  Laterality: Left;  . TOTAL KNEE ARTHROPLASTY Right 05/28/2018   Procedure: TOTAL KNEE ARTHROPLASTY;  Surgeon: Christena Flake, MD;  Location: [facility] ORS;  Service:  Orthopedics;  Laterality: Right;    Prior to Admission medications   Medication Sig Start Date End Date Taking? Authorizing Provider  albuterol (PROVENTIL HFA;VENTOLIN HFA) 108 (90 Base) MCG/ACT inhaler Inhale 1-2 puffs into the lungs every 6 (six) hours as needed for wheezing or shortness of breath.    [provider]  amitriptyline (ELAVIL) 25 MG tablet Take 25 mg by mouth at bedtime.    [provider]  aspirin EC 81 MG tablet Take 81 mg by mouth 2 (two) times daily.    [provider]  Calcium Carb-Cholecalciferol (CALCIUM 600 + D PO) Take 1 tablet by mouth 2 (two) times daily.    [provider]  enoxaparin (LOVENOX) 40 MG/0.4ML injection Inject 0.4 mLs (40 mg total) into the skin every 12 (twelve) hours. 05/30/18   Dedra Skeens, PA-C  ferrous sulfate 325 (65 FE) MG tablet Take 325 mg by mouth daily with breakfast.    [provider]  gabapentin (NEURONTIN) 600 MG tablet Take 600 mg by mouth 2 (two) times daily. May repeat a 3rd dose if needed. 03/11/18   [provider]  LATUDA 60 MG TABS Take 60 mg by mouth at bedtime. 05/08/18   [provider]  lisinopril-hydrochlorothiazide (PRINZIDE,ZESTORETIC) 20-12.5 MG tablet Take 1 tablet by mouth daily.     [provider]  lurasidone (LATUDA) 20 MG TABS  tablet Take 20 mg by mouth at bedtime.    [provider]  omeprazole (PRILOSEC) 20 MG capsule Take 20 mg by mouth daily before breakfast.     [provider]  oxyCODONE (OXY IR/ROXICODONE) 5 MG immediate release tablet Take 1-2 tablets (5-10 mg total) by mouth every 4 (four) hours as needed for moderate pain (pain score 4-6). 05/30/18   Dedra SkeensMundy, Todd, PA-C  pravastatin (PRAVACHOL) 10 MG tablet Take 10 mg by mouth at bedtime.     [provider]  propranolol ER (INDERAL LA) 80 MG 24 hr capsule Take 80 mg by mouth daily. 03/11/18   [provider]  rOPINIRole (REQUIP) 4 MG tablet Take 4 mg by mouth at  bedtime. 02/13/18   [provider]  traMADol (ULTRAM) 50 MG tablet Take 1 tablet (50 mg total) by mouth every 6 (six) hours as needed for moderate pain. 05/30/18   Dedra SkeensMundy, Todd, PA-C  venlafaxine XR (EFFEXOR-XR) 150 MG 24 hr capsule Take 150 mg by mouth daily with breakfast.    [provider]    Allergies Mellaril [thioridazine]  No family history on file.  Social History Social History   Tobacco Use  . Smoking status: Never Smoker  . Smokeless tobacco: Never Used  Substance Use Topics  . Alcohol use: No  . Drug use: No    Review of Systems Constitutional: No fever/chills Eyes: No visual changes. ENT: No sore throat. Cardiovascular: Denies chest pain. Respiratory: Positive for shortness of breath. Gastrointestinal: No abdominal pain.  No nausea, no vomiting.  No diarrhea.  No constipation. Genitourinary: Negative for dysuria. Musculoskeletal: Negative for back pain. Skin: Negative for rash. Neurological: Negative for headaches, focal weakness or numbness.   ____________________________________________   PHYSICAL EXAM:  VITAL SIGNS: ED Triage Vitals [06/05/18 1311]  Enc Vitals Group     BP (!) 145/90     Pulse Rate 65     Resp 20     Temp 99.4 F (37.4 C)     Temp Source Oral     SpO2 97 %     Weight 295 lb (133.8 kg)     Height 5\' 5"  (1.651 m)     Head Circumference      Peak Flow      Pain Score 7     Pain Loc      Pain Edu?      Excl. in GC?     Constitutional: Alert and oriented x4 slightly short of breath nontoxic no diaphoresis speaks full clear sentences Eyes: PERRL EOMI. Head: Atraumatic. Nose: No congestion/rhinnorhea. Mouth/Throat: No trismus Neck: No stridor.   Cardiovascular: Normal rate, regular rhythm. Grossly normal heart sounds.  Good peripheral circulation. Respiratory: Slightly increased respiratory effort.  No retractions. Lungs CTAB and moving good air Gastrointestinal: Obese soft nontender Musculoskeletal:  Bilateral lower extremity slightly edematous right mildly greater than left.  Surgical wound appears healthy although with surrounding faint erythema Neurologic:  Normal speech and language. No gross focal neurologic deficits are appreciated. Skin:  Skin is warm, dry and intact. No rash noted. Psychiatric: Mood and affect are normal. Speech and behavior are normal.    ____________________________________________   DIFFERENTIAL includes but not limited to  DVT, pulmonary embolism, postsurgical infection ____________________________________________   LABS (all labs ordered are listed, but only abnormal results are displayed)  Labs Reviewed - No data to display   __________________________________________  EKG   ____________________________________________  RADIOLOGY  CT angiogram of the chest reviewed by me  with no acute disease Ultrasound of the right lower extremity reviewed by me with no clot but possible ruptured Baker's cyst ____________________________________________   PROCEDURES  Procedure(s) performed: no  Procedures  Critical Care performed: no  Observation: no ____________________________________________   INITIAL IMPRESSION / ASSESSMENT AND PLAN / ED COURSE  Pertinent labs & imaging results that were available during my care of the patient were reviewed by me and considered in my medical decision making (see chart for details).       ----------------------------------------- 4:08 PM on 06/05/2018 -----------------------------------------  Fortunately the patient's ultrasound and CT angiogram are reassuring.  She does have some warmth and erythema raising some concern for a postoperative infection.  The patient called her orthopedic surgeon's office while I was in the room with her and she is able to go to see Dr. Joice Lofts right now. ____________________________________________   FINAL CLINICAL IMPRESSION(S) / ED DIAGNOSES  Final diagnoses:  Leg  swelling      NEW MEDICATIONS STARTED DURING THIS VISIT:  Discharge Medication List as of 06/05/2018  4:08 PM       Note:  This document was prepared using Dragon voice recognition software and may include unintentional dictation errors.     Merrily Brittle, MD 06/05/18 2142

## 2018-06-05 NOTE — ED Notes (Signed)
Patient transported to Ultrasound 

## 2018-06-05 NOTE — ED Triage Notes (Signed)
Pt reports Dr. Joice LoftsPoggi sent her here to rule out a PE. Pt reports total knee replacement to left knee last Thursday and last few days with increase swelling, pain and redness. Pt also reports increased SOB with exertion.

## 2018-06-05 NOTE — ED Notes (Signed)
Pt returned to room att 

## 2018-06-05 NOTE — Discharge Instructions (Signed)
It was a pleasure to take care of you today, and thank you for coming to our emergency department.  If you have any questions or concerns before leaving please ask the nurse to grab me and I'm more than happy to go through your aftercare instructions again.  If you were prescribed any opioid pain medication today such as Norco, Vicodin, Percocet, morphine, hydrocodone, or oxycodone please make sure you do not drive when you are taking this medication as it can alter your ability to drive safely.  If you have any concerns once you are home that you are not improving or are in fact getting worse before you can make it to your follow-up appointment, please do not hesitate to call 911 and come back for further evaluation.  Merrily BrittleNeil Arfa Lamarca, MD  Results for orders placed or performed during the hospital encounter of 05/28/18  CBC with Differential/Platelet  Result Value Ref Range   WBC 10.6 3.6 - 11.0 K/uL   RBC 3.14 (L) 3.80 - 5.20 MIL/uL   Hemoglobin 10.0 (L) 12.0 - 16.0 g/dL   HCT 91.429.0 (L) 78.235.0 - 95.647.0 %   MCV 92.6 80.0 - 100.0 fL   MCH 31.9 26.0 - 34.0 pg   MCHC 34.5 32.0 - 36.0 g/dL   RDW 21.313.4 08.611.5 - 57.814.5 %   Platelets 255 150 - 440 K/uL   Neutrophils Relative % 89 %   Neutro Abs 9.3 (H) 1.4 - 6.5 K/uL   Lymphocytes Relative 7 %   Lymphs Abs 0.8 (L) 1.0 - 3.6 K/uL   Monocytes Relative 4 %   Monocytes Absolute 0.4 0.2 - 0.9 K/uL   Eosinophils Relative 0 %   Eosinophils Absolute 0.0 0 - 0.7 K/uL   Basophils Relative 0 %   Basophils Absolute 0.0 0 - 0.1 K/uL  Basic metabolic panel  Result Value Ref Range   Sodium 131 (L) 135 - 145 mmol/L   Potassium 4.2 3.5 - 5.1 mmol/L   Chloride 101 101 - 111 mmol/L   CO2 24 22 - 32 mmol/L   Glucose, Bld 167 (H) 65 - 99 mg/dL   BUN 13 6 - 20 mg/dL   Creatinine, Ser 4.690.85 0.44 - 1.00 mg/dL   Calcium 8.3 (L) 8.9 - 10.3 mg/dL   GFR calc non Af Amer >60 >60 mL/min   GFR calc Af Amer >60 >60 mL/min   Anion gap 6 5 - 15  Basic metabolic panel  Result  Value Ref Range   Sodium 128 (L) 135 - 145 mmol/L   Potassium 4.0 3.5 - 5.1 mmol/L   Chloride 94 (L) 101 - 111 mmol/L   CO2 27 22 - 32 mmol/L   Glucose, Bld 102 (H) 65 - 99 mg/dL   BUN 15 6 - 20 mg/dL   Creatinine, Ser 6.290.81 0.44 - 1.00 mg/dL   Calcium 8.3 (L) 8.9 - 10.3 mg/dL   GFR calc non Af Amer >60 >60 mL/min   GFR calc Af Amer >60 >60 mL/min   Anion gap 7 5 - 15   Dg Chest 2 View  Result Date: 05/20/2018 CLINICAL DATA:  Preoperative evaluation.  No chest complaints. EXAM: CHEST - 2 VIEW COMPARISON:  None. FINDINGS: Cardiac contours upper limits of normal. Bilateral mid lower lung heterogeneous opacities. No pleural effusion or pneumothorax. Thoracic spine degenerative changes. Cholecystectomy clips. IMPRESSION: Heterogeneous opacities lung bases bilaterally may represent atelectasis or scarring. Mild cardiomegaly. Electronically Signed   By: Annia Beltrew  Davis M.D.   On: 05/20/2018  22:03   Ct Angio Chest Pe W/cm &/or Wo Cm  Result Date: 06/05/2018 CLINICAL DATA:  Shortness of breath with exertion. Patient status post right knee replacement 05/28/2018. EXAM: CT ANGIOGRAPHY CHEST WITH CONTRAST TECHNIQUE: Multidetector CT imaging of the chest was performed using the standard protocol during bolus administration of intravenous contrast. Multiplanar CT image reconstructions and MIPs were obtained to evaluate the vascular anatomy. CONTRAST:  100 ml ISOVUE-370 IOPAMIDOL (ISOVUE-370) INJECTION 76% COMPARISON:  PA and lateral chest 05/20/2018. FINDINGS: Cardiovascular: No pulmonary embolus is identified. No pericardial effusion. Heart size is upper normal. A few calcific coronary artery calcifications are seen. Mediastinum/Nodes: No enlarged mediastinal, hilar, or axillary lymph nodes. Thyroid gland, trachea, and esophagus demonstrate no significant findings. Calcified left hilar lymph node consistent with old granulomatous disease incidentally noted. Lungs/Pleura: Lungs are clear. No pleural effusion or  pneumothorax. Upper Abdomen: The liver is low attenuating consistent with fatty infiltration. Scattered punctate calcifications in the liver and spleen consistent with old granulomatous disease noted. The patient is status post cholecystectomy. Musculoskeletal: No fracture or focal bony lesion. Review of the MIP images confirms the above findings. IMPRESSION: Negative for pulmonary embolus or acute disease. Scattered calcific coronary artery calcifications. Fatty infiltration of the liver. Old granulomatous disease. Electronically Signed   By: Drusilla Kanner M.D.   On: 06/05/2018 15:50   US Venous Img Lower Unilateral Right  Result Date: 06/05/2018 CLINICAL DATA:  62 year old female with right lower extremity status post knee replacement on 05/28/2018 EXAM: RIGHT LOWER EXTREMITY VENOUS DOPPLER ULTRASOUND TECHNIQUE: Gray-scale sonography with graded compression, as well as color Doppler and duplex ultrasound were performed to evaluate the lower extremity deep venous systems from the level of the common femoral vein and including the common femoral, femoral, profunda femoral, popliteal and calf veins including the posterior tibial, peroneal and gastrocnemius veins when visible. The superficial great saphenous vein was also interrogated. Spectral Doppler was utilized to evaluate flow at rest and with distal augmentation maneuvers in the common femoral, femoral and popliteal veins. COMPARISON:  None. FINDINGS: Contralateral Common Femoral Vein: Respiratory phasicity is normal and symmetric with the symptomatic side. No evidence of thrombus. Normal compressibility. Common Femoral Vein: No evidence of thrombus. Normal compressibility, respiratory phasicity and response to augmentation. Saphenofemoral Junction: No evidence of thrombus. Normal compressibility and flow on color Doppler imaging. Profunda Femoral Vein: No evidence of thrombus. Normal compressibility and flow on color Doppler imaging. Femoral Vein: No  evidence of thrombus. Normal compressibility, respiratory phasicity and response to augmentation. Popliteal Vein: No evidence of thrombus. Normal compressibility, respiratory phasicity and response to augmentation. Calf Veins: No evidence of thrombus. Normal compressibility and flow on color Doppler imaging. Superficial Great Saphenous Vein: No evidence of thrombus. Normal compressibility. Venous Reflux:  None. Other Findings: Trace fluid in the popliteal fossa may represent a small, or recently ruptured Baker's cyst. IMPRESSION: 1. No evidence of deep venous thrombosis. 2. Small amount of fluid in the popliteal fossa may represent a small, or recently ruptured Baker's cyst. Electronically Signed   By: Malachy Moan M.D.   On: 06/05/2018 15:45   Dg Knee Right Port  Result Date: 05/28/2018 CLINICAL DATA:  Postop knee replacement EXAM: PORTABLE RIGHT KNEE - 1-2 VIEW COMPARISON:  MRI 03/20/2018 FINDINGS: Changes of right knee replacement. No hardware or bony complicating feature. IMPRESSION: Right knee replacement.  No complicating feature. Electronically Signed   By: Charlett Nose M.D.   On: 05/28/2018 10:33

## 2019-08-20 IMAGING — CR DG CHEST 2V
2 series · 2 of 2 positions shown · non-contrast
Comparison: None.

CLINICAL DATA: Preoperative evaluation.  No chest complaints.

EXAM:
CHEST - 2 VIEW

[chest pa]
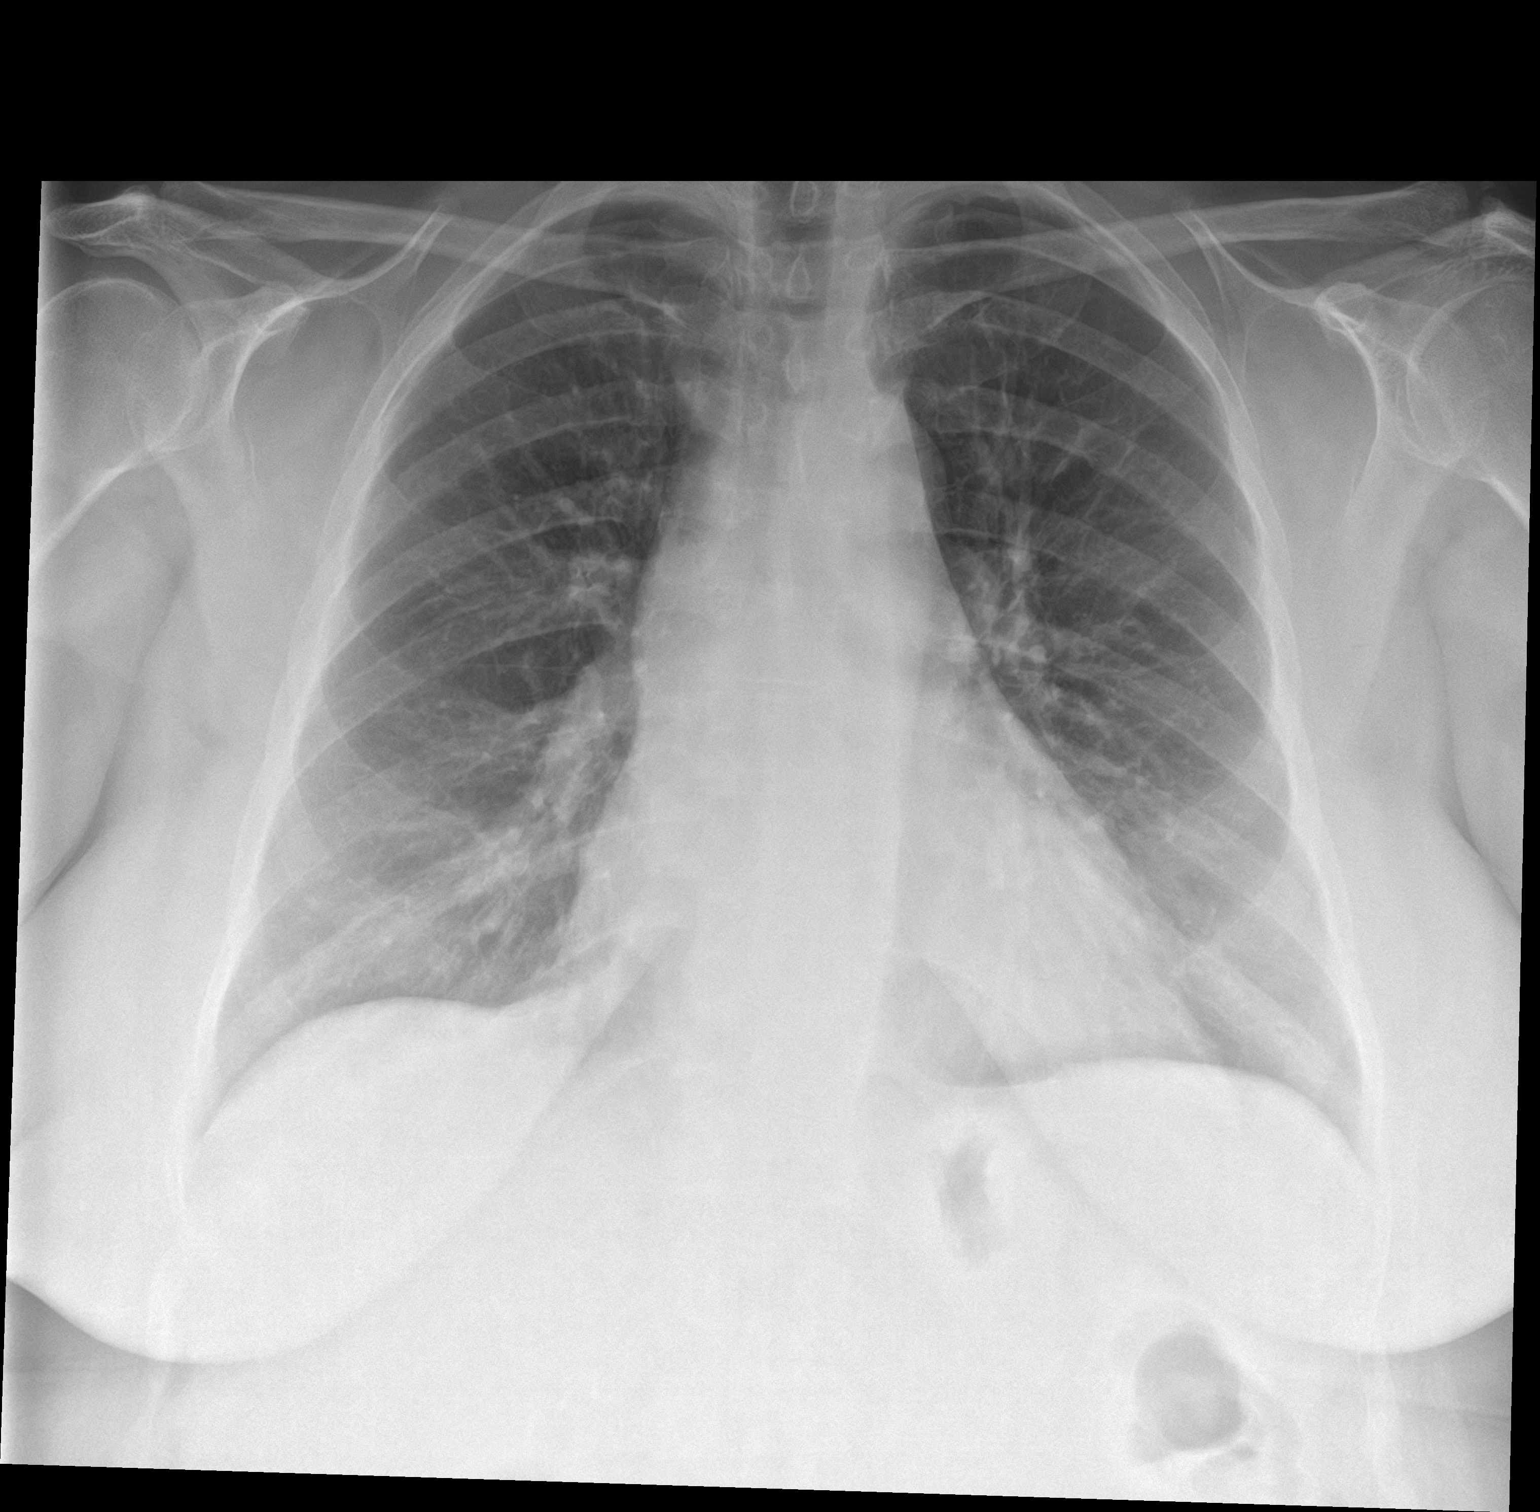

[chest lat]
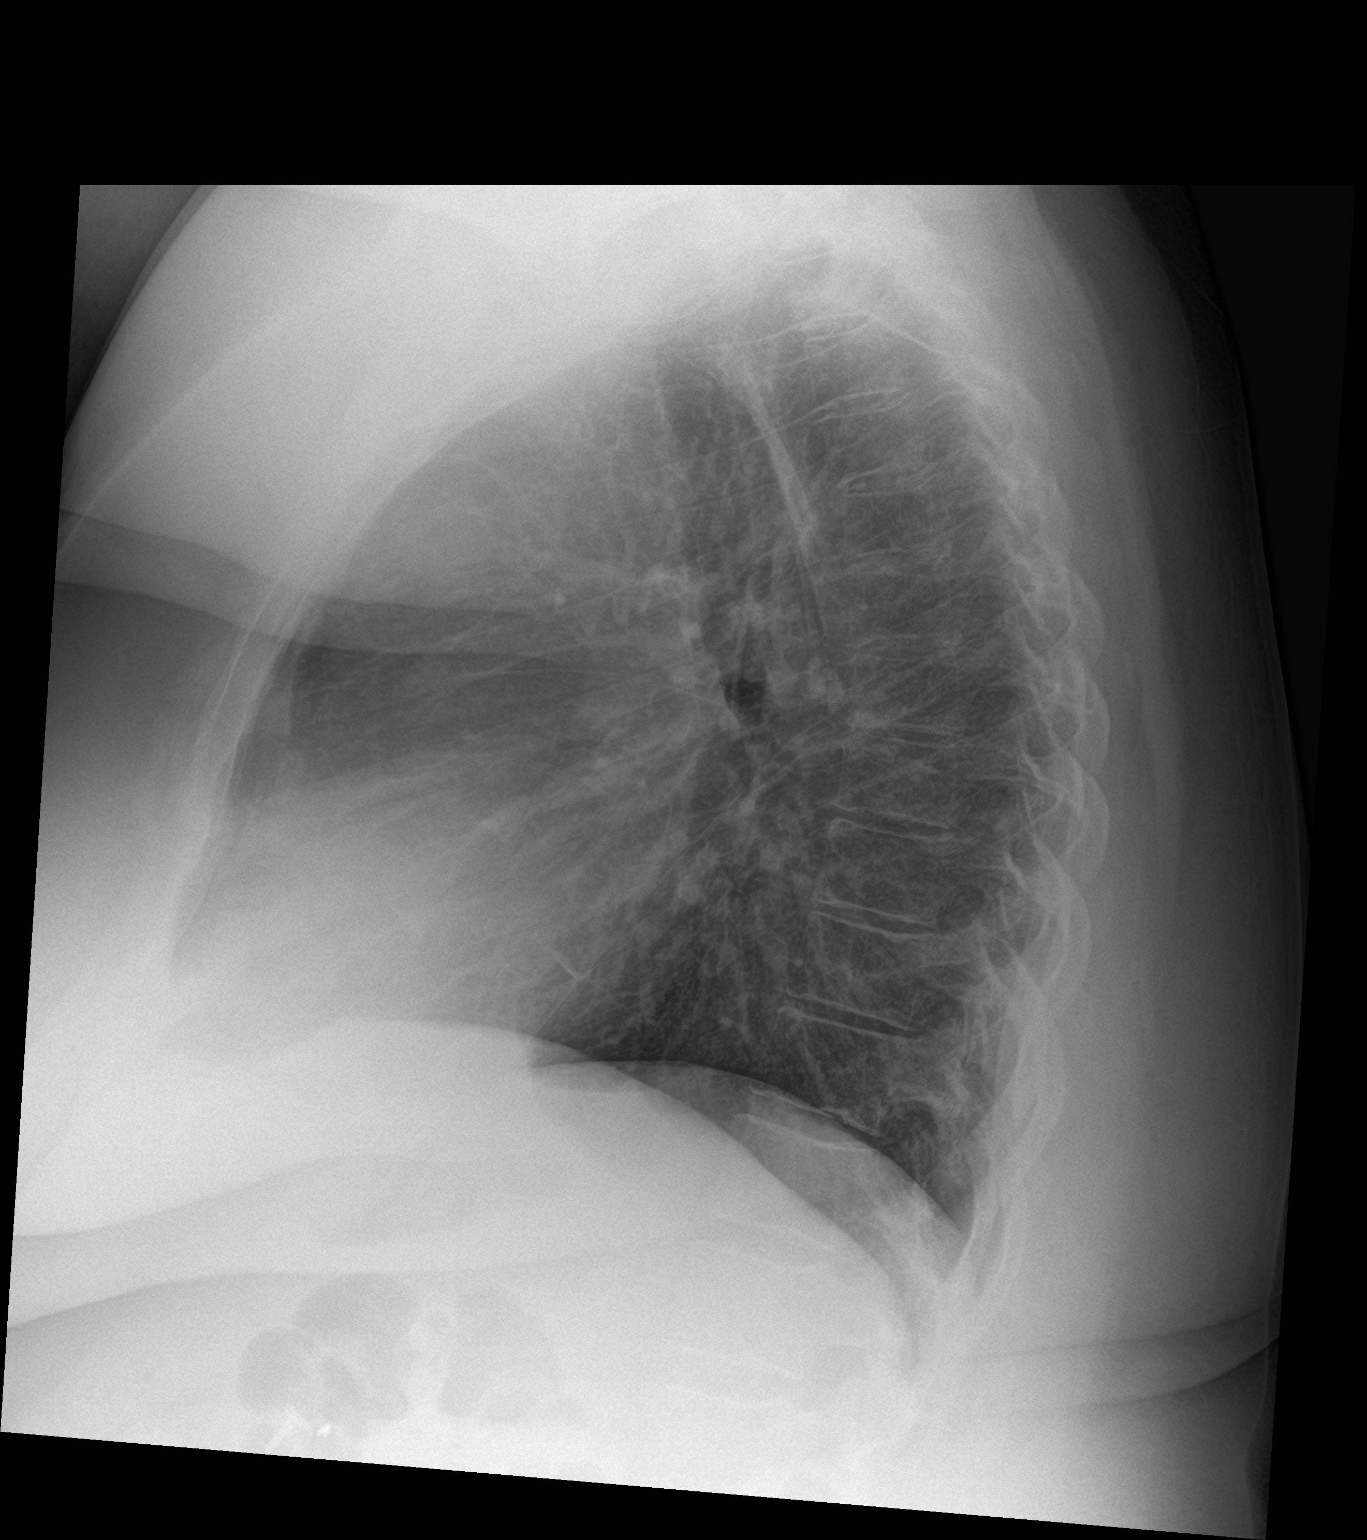

[2 of 2 positions shown; findings below may reference images not displayed]

FINDINGS: Cardiac contours upper limits of normal. Bilateral mid lower lung
heterogeneous opacities. No pleural effusion or pneumothorax.
Thoracic spine degenerative changes. Cholecystectomy clips.
IMPRESSION: Heterogeneous opacities lung bases bilaterally may represent
atelectasis or scarring.

Mild cardiomegaly.

## 2019-10-20 ENCOUNTER — Other Ambulatory Visit: Payer: Self-pay | Admitting: Student

## 2019-10-20 DIAGNOSIS — M47816 Spondylosis without myelopathy or radiculopathy, lumbar region: Secondary | ICD-10-CM

## 2019-10-20 DIAGNOSIS — G8929 Other chronic pain: Secondary | ICD-10-CM

## 2019-10-28 ENCOUNTER — Other Ambulatory Visit: Payer: Self-pay | Admitting: Family Medicine

## 2019-10-28 DIAGNOSIS — Z1231 Encounter for screening mammogram for malignant neoplasm of breast: Secondary | ICD-10-CM

## 2019-12-05 ENCOUNTER — Ambulatory Visit
Admission: RE | Admit: 2019-12-05 | Discharge: 2019-12-05 | Disposition: A | Discharge status: Home / Self Care | Payer: Medicare Other | Source: Ambulatory Visit | Attending: Student | Admitting: Student

## 2019-12-05 ENCOUNTER — Other Ambulatory Visit: Payer: Self-pay

## 2019-12-05 DIAGNOSIS — M545 Low back pain: Secondary | ICD-10-CM | POA: Insufficient documentation

## 2019-12-05 DIAGNOSIS — G8929 Other chronic pain: Secondary | ICD-10-CM | POA: Diagnosis present

## 2019-12-05 DIAGNOSIS — M47816 Spondylosis without myelopathy or radiculopathy, lumbar region: Secondary | ICD-10-CM | POA: Insufficient documentation

## 2020-03-08 ENCOUNTER — Inpatient Hospital Stay: Admission: RE | Admit: 2020-03-08 | Payer: Medicare Other | Source: Ambulatory Visit

## 2020-03-27 ENCOUNTER — Inpatient Hospital Stay: Admission: RE | Admit: 2020-03-27 | Payer: Medicare Other | Source: Ambulatory Visit

## 2021-01-19 ENCOUNTER — Other Ambulatory Visit: Payer: Self-pay | Admitting: Family Medicine

## 2021-01-19 DIAGNOSIS — Z1231 Encounter for screening mammogram for malignant neoplasm of breast: Secondary | ICD-10-CM

## 2021-10-16 ENCOUNTER — Ambulatory Visit: Admission: RE | Admit: 2021-10-16 | Payer: Medicare (Managed Care) | Source: Home / Self Care

## 2021-10-16 ENCOUNTER — Encounter: Admission: RE | Payer: Self-pay | Source: Home / Self Care

## 2021-10-16 SURGERY — COLONOSCOPY
Anesthesia: General

## 2022-03-12 ENCOUNTER — Encounter
Admission: RE | Disposition: A | Discharge status: Home / Self Care | Payer: Self-pay | Source: Ambulatory Visit | Attending: Gastroenterology

## 2022-03-12 ENCOUNTER — Ambulatory Visit
Admission: RE | Admit: 2022-03-12 | Discharge: 2022-03-12 | Disposition: A | Discharge status: Home / Self Care | Payer: Medicare HMO | Source: Ambulatory Visit | Attending: Gastroenterology | Admitting: Gastroenterology

## 2022-03-12 ENCOUNTER — Encounter: Payer: Self-pay | Admitting: *Deleted

## 2022-03-12 ENCOUNTER — Other Ambulatory Visit: Payer: Self-pay

## 2022-03-12 ENCOUNTER — Ambulatory Visit: Payer: Medicare HMO | Admitting: Anesthesiology

## 2022-03-12 DIAGNOSIS — E669 Obesity, unspecified: Secondary | ICD-10-CM | POA: Insufficient documentation

## 2022-03-12 DIAGNOSIS — G4733 Obstructive sleep apnea (adult) (pediatric): Secondary | ICD-10-CM | POA: Insufficient documentation

## 2022-03-12 DIAGNOSIS — Z1211 Encounter for screening for malignant neoplasm of colon: Secondary | ICD-10-CM | POA: Insufficient documentation

## 2022-03-12 DIAGNOSIS — F319 Bipolar disorder, unspecified: Secondary | ICD-10-CM | POA: Insufficient documentation

## 2022-03-12 DIAGNOSIS — I10 Essential (primary) hypertension: Secondary | ICD-10-CM | POA: Insufficient documentation

## 2022-03-12 DIAGNOSIS — Z87442 Personal history of urinary calculi: Secondary | ICD-10-CM | POA: Insufficient documentation

## 2022-03-12 DIAGNOSIS — F419 Anxiety disorder, unspecified: Secondary | ICD-10-CM | POA: Insufficient documentation

## 2022-03-12 DIAGNOSIS — Z9049 Acquired absence of other specified parts of digestive tract: Secondary | ICD-10-CM | POA: Insufficient documentation

## 2022-03-12 DIAGNOSIS — K640 First degree hemorrhoids: Secondary | ICD-10-CM | POA: Insufficient documentation

## 2022-03-12 DIAGNOSIS — G40909 Epilepsy, unspecified, not intractable, without status epilepticus: Secondary | ICD-10-CM | POA: Insufficient documentation

## 2022-03-12 DIAGNOSIS — K219 Gastro-esophageal reflux disease without esophagitis: Secondary | ICD-10-CM | POA: Diagnosis not present

## 2022-03-12 DIAGNOSIS — E7800 Pure hypercholesterolemia, unspecified: Secondary | ICD-10-CM | POA: Diagnosis not present

## 2022-03-12 HISTORY — PX: COLONOSCOPY WITH PROPOFOL: SHX5780

## 2022-03-12 SURGERY — COLONOSCOPY WITH PROPOFOL
Anesthesia: General

## 2022-03-12 MED ORDER — SODIUM CHLORIDE 0.9 % IV SOLN: INTRAVENOUS | Status: DC

## 2022-03-12 MED ORDER — PROPOFOL 500 MG/50ML IV EMUL
INTRAVENOUS | Status: DC | PRN
  Administered 2022-03-12: 150 ug/kg/min via INTRAVENOUS

## 2022-03-12 MED ORDER — PROPOFOL 10 MG/ML IV BOLUS
INTRAVENOUS | Status: DC | PRN
  Administered 2022-03-12: 80 mg via INTRAVENOUS

## 2022-03-12 MED ORDER — LIDOCAINE HCL (CARDIAC) PF 100 MG/5ML IV SOSY
PREFILLED_SYRINGE | INTRAVENOUS | Status: DC | PRN
  Administered 2022-03-12: 50 mg via INTRAVENOUS

## 2022-03-12 MED ORDER — PHENYLEPHRINE HCL (PRESSORS) 10 MG/ML IV SOLN
INTRAVENOUS | Status: DC | PRN
  Administered 2022-03-12: 160 ug via INTRAVENOUS

## 2022-03-12 NOTE — Anesthesia Procedure Notes (Signed)
Date/Time: 03/12/2022 12:31 PM ?Performed by: Ginger Carne, CRNA ?Pre-anesthesia Checklist: Patient identified, Emergency Drugs available, Suction available, Patient being monitored and Timeout performed ?Patient Re-evaluated:Patient Re-evaluated prior to induction ?Oxygen Delivery Method: Nasal cannula ?Preoxygenation: Pre-oxygenation with 100% oxygen ?Induction Type: IV induction ? ? ? ? ?

## 2022-03-12 NOTE — Interval H&P Note (Signed)
History and Physical Interval Note: ? ?03/12/2022 ?12:25 PM ? ?Paula Hammond  has presented today for surgery, with the diagnosis of Z12.11 - Colon cancer screening.  The various methods of treatment have been discussed with the patient and family. After consideration of risks, benefits and other options for treatment, the patient has consented to  Procedure(s): ?COLONOSCOPY WITH PROPOFOL (N/A) as a surgical intervention.  The patient's history has been reviewed, patient examined, no change in status, stable for surgery.  I have reviewed the patient's chart and labs.  Questions were answered to the patient's satisfaction.   ? ? ?Hilton Cork Fleur Audino ? ?Ok to proceed with colonoscopy ?

## 2022-03-12 NOTE — Anesthesia Preprocedure Evaluation (Addendum)
Anesthesia Evaluation  ?Patient identified by MRN, date of birth, ID band ?Patient awake ? ? ? ?Reviewed: ?Allergy & Precautions, NPO status , Patient's Chart, lab work & pertinent test results, reviewed documented beta blocker date and time  ? ?History of Anesthesia Complications ?(+) PONV and history of anesthetic complications ? ?Airway ?Mallampati: III ? ?TM Distance: >3 FB ? ? ? ? Dental ?no notable dental hx. ?(+) Dental Advidsory Given ?  ?Pulmonary ?neg shortness of breath, sleep apnea and Continuous Positive Airway Pressure Ventilation , neg COPD, neg recent URI,  ?  ?Pulmonary exam normal ? ? ? ? ? ? ? Cardiovascular ?Exercise Tolerance: Good ?hypertension, Pt. on medications ?(-) angina+ DOE  ?(-) CAD, (-) Past MI, (-) Cardiac Stents and (-) CABG Normal cardiovascular exam(-) dysrhythmias (-) Valvular Problems/Murmurs ? ?2022: ?Gated scintigraphy revealed LVEF 54%. SPECT imaging revealed mild inferior scar with peri-infarct ischemia. ?  ?Neuro/Psych ?Seizures -, Well Controlled,  PSYCHIATRIC DISORDERS Anxiety Depression Bipolar Disorder history of epilepsy surgery ?  ? GI/Hepatic ?Neg liver ROS, GERD  Controlled and Medicated,  ?Endo/Other  ?neg diabetesMorbid obesity ? Renal/GU ?negative Renal ROS  ? ?  ?Musculoskeletal ? ?(+) Arthritis ,  ? Abdominal ?(+) + obese,   ?Peds ? Hematology ?negative hematology ROS ?(+)   ?Anesthesia Other Findings ?Past Medical History: ?No date: Anxiety ?No date: Arthritis ?No date: Bipolar disorder (HCC) ?No date: Depression ?No date: GERD (gastroesophageal reflux disease) ?No date: History of kidney stones ?No date: Hypercholesteremia ?No date: Hypertension ?No date: Kidney stones ?1991: PONV (postoperative nausea and vomiting) ?    Comment:  brain surgery done in [place] ?No date: Restless leg ?No date: Seizures (HCC) ? ? Reproductive/Obstetrics ?negative OB ROS ? ?  ? ? ? ? ? ? ? ? ? ? ? ? ? ?  ?  ? ? ? ? ? ? ? ?Anesthesia  Physical ? ?Anesthesia Plan ? ?ASA: III ? ?Anesthesia Plan: General  ? ?Post-op Pain Management:   ? ?Induction: Intravenous ? ?PONV Risk Score and Plan: 3 and Propofol infusion and Treatment may vary due to age or medical condition ? ?Airway Management Planned: Natural Airway and Simple Face Mask ? ?Additional Equipment:  ? ?Intra-op Plan:  ? ?Post-operative Plan:  ? ?Informed Consent: I have reviewed the patients History and Physical, chart, labs and discussed the procedure including the risks, benefits and alternatives for the proposed anesthesia with the patient or authorized representative who has indicated his/her understanding and acceptance.  ? ? ? ?Dental advisory given ? ?Plan Discussed with: CRNA ? ?Anesthesia Plan Comments:   ? ? ? ? ? ?Anesthesia Quick Evaluation ? ?

## 2022-03-12 NOTE — Transfer of Care (Signed)
Immediate Anesthesia Transfer of Care Note ? ?Patient: Paula Hammond ? ?Procedure(s) Performed: COLONOSCOPY WITH PROPOFOL ? ?Patient Location: PACU ? ?Anesthesia Type:General ? ?Level of Consciousness: awake, alert  and oriented ? ?Airway & Oxygen Therapy: Patient Spontanous Breathing ? ?Post-op Assessment: Report given to RN and Post -op Vital signs reviewed and stable ? ?Post vital signs: Reviewed and stable ? ?Last Vitals:  ?Vitals Value Taken Time  ?BP 89/48 03/12/22 1301  ?Temp 36.4 ?C 03/12/22 1255  ?Pulse 62 03/12/22 1301  ?Resp 11 03/12/22 1301  ?SpO2 100 % 03/12/22 1301  ? ? ?Last Pain:  ?Vitals:  ? 03/12/22 1255  ?TempSrc: Temporal  ?PainSc: 0-No pain  ?   ? ?  ? ?Complications: No notable events documented. ?

## 2022-03-12 NOTE — H&P (Signed)
Outpatient short stay form Pre-procedure ?03/12/2022  ?Regis Bill, MD ? ?Primary Physician: [facility] ? ?Reason for visit:  Screening ? ?History of present illness:   ? ?66 y/o lady with history of obesity, HLD, and OSA here for index screening colonoscopy. No blood thinners. No family history of GI malignancies. History of cholecystectomy and c-sections. ? ? ? ?Current Facility-Administered Medications:  ?  0.9 %  sodium chloride infusion, , Intravenous, Continuous, Trae Bovenzi, Rossie Muskrat, MD ? ?Medications Prior to Admission  ?Medication Sig Dispense Refill Last Dose  ? albuterol (PROVENTIL HFA;VENTOLIN HFA) 108 (90 Base) MCG/ACT inhaler Inhale 1-2 puffs into the lungs every 6 (six) hours as needed for wheezing or shortness of breath.   Past Week  ? aspirin EC 81 MG tablet Take 81 mg by mouth 2 (two) times daily.   03/11/2022  ? atorvastatin (LIPITOR) 40 MG tablet Take 40 mg by mouth daily.   03/11/2022  ? Calcium Carb-Cholecalciferol (CALCIUM 600 + D PO) Take 1 tablet by mouth 2 (two) times daily.   03/11/2022  ? ferrous sulfate 325 (65 FE) MG tablet Take 325 mg by mouth daily with breakfast.   Past Week  ? gabapentin (NEURONTIN) 600 MG tablet Take 600 mg by mouth 2 (two) times daily. May repeat a 3rd dose if needed.  1 03/11/2022  ? LATUDA 60 MG TABS Take 60 mg by mouth at bedtime.  1 03/11/2022  ? lisinopril-hydrochlorothiazide (PRINZIDE,ZESTORETIC) 20-12.5 MG tablet Take 1 tablet by mouth daily.    03/12/2022  ? omeprazole (PRILOSEC) 20 MG capsule Take 20 mg by mouth daily before breakfast.    03/11/2022  ? pravastatin (PRAVACHOL) 10 MG tablet Take 10 mg by mouth at bedtime.    03/11/2022  ? rOPINIRole (REQUIP) 4 MG tablet Take 4 mg by mouth at bedtime.  3 03/11/2022  ? venlafaxine XR (EFFEXOR-XR) 150 MG 24 hr capsule Take 150 mg by mouth daily with breakfast.   03/11/2022  ? amitriptyline (ELAVIL) 25 MG tablet Take 25 mg by mouth at bedtime. (Patient not taking: Reported on 03/12/2022)   Not  Taking  ? enoxaparin (LOVENOX) 40 MG/0.4ML injection Inject 0.4 mLs (40 mg total) into the skin every 12 (twelve) hours. (Patient not taking: Reported on 03/12/2022) 14 Syringe 0 Not Taking  ? lurasidone (LATUDA) 20 MG TABS tablet Take 20 mg by mouth at bedtime.     ? oxyCODONE (OXY IR/ROXICODONE) 5 MG immediate release tablet Take 1-2 tablets (5-10 mg total) by mouth every 4 (four) hours as needed for moderate pain (pain score 4-6). (Patient not taking: Reported on 03/12/2022) 40 tablet 0 Not Taking  ? propranolol ER (INDERAL LA) 80 MG 24 hr capsule Take 80 mg by mouth daily. (Patient not taking: Reported on 03/12/2022)  1 Not Taking  ? traMADol (ULTRAM) 50 MG tablet Take 1 tablet (50 mg total) by mouth every 6 (six) hours as needed for moderate pain. (Patient not taking: Reported on 03/12/2022) 40 tablet 1 Not Taking  ? ? ? ?Allergies  ?Allergen Reactions  ? Mellaril [Thioridazine] Hives and Itching  ? ? ? ?Past Medical History:  ?Diagnosis Date  ? Anxiety   ? Arthritis   ? Bipolar disorder (HCC)   ? Depression   ? GERD (gastroesophageal reflux disease)   ? History of kidney stones   ? Hypercholesteremia   ? Hypertension   ? Kidney stones   ? PONV (postoperative nausea and vomiting) 1991  ? brain surgery done in  [place]  ? Restless leg   ? Seizures (HCC)   ? ? ?Review of systems:  Otherwise negative.  ? ? ?Physical Exam ? ?Gen: Alert, oriented. Appears stated age.  ?HEENT: PERRLA. ?Lungs: No respiratory distress ?CV: RRR ?Abd: soft, benign, no masses ?Ext: No edema ? ? ? ?Planned procedures: Proceed with colonoscopy. The patient understands the nature of the planned procedure, indications, risks, alternatives and potential complications including but not limited to bleeding, infection, perforation, damage to internal organs and possible oversedation/side effects from anesthesia. The patient agrees and gives consent to proceed.  ?Please refer to procedure notes for findings, recommendations and patient  disposition/instructions.  ? ? ? ?Regis Bill, MD ?[facility] ? ? ? ?  ? ?

## 2022-03-12 NOTE — Op Note (Signed)
[facility] ?Gastroenterology ?Patient Name: Paula Hammond ?Procedure Date: 03/12/2022 11:36 AM ?MRN: [id number] ?Account #: [id number] ?Date of Birth: 06/23/1956 ?Admit Type: Outpatient ?Age: 66 ?Room: [facility] ENDO ROOM 1 ?Gender: Female ?Note Status: Finalized ?Instrument Name: Colonscope [id number] ?Procedure:             Colonoscopy ?Indications:           Screening for colorectal malignant neoplasm ?Providers:             Andrey Farmer MD, MD ?Medicines:             Monitored Anesthesia Care ?Complications:         No immediate complications. ?Procedure:             Pre-Anesthesia Assessment: ?                       - Prior to the procedure, a History and Physical was  ?                       performed, and patient medications and allergies were  ?                       reviewed. The patient is competent. The risks and  ?                       benefits of the procedure and the sedation options and  ?                       risks were discussed with the patient. All questions  ?                       were answered and informed consent was obtained.  ?                       Patient identification and proposed procedure were  ?                       verified by the physician, the nurse, the  ?                       anesthesiologist, the anesthetist and the technician  ?                       in the endoscopy suite. Mental Status Examination:  ?                       alert and oriented. Airway Examination: normal  ?                       oropharyngeal airway and neck mobility. Respiratory  ?                       Examination: clear to auscultation. CV Examination:  ?                       normal. Prophylactic Antibiotics: The patient does not  ?                       require prophylactic antibiotics. Prior  ?  Anticoagulants: The patient has taken no previous  ?                       anticoagulant or antiplatelet agents. ASA Grade  ?                       Assessment: III - A patient  with severe systemic  ?                       disease. After reviewing the risks and benefits, the  ?                       patient was deemed in satisfactory condition to  ?                       undergo the procedure. The anesthesia plan was to use  ?                       monitored anesthesia care (MAC). Immediately prior to  ?                       administration of medications, the patient was  ?                       re-assessed for adequacy to receive sedatives. The  ?                       heart rate, respiratory rate, oxygen saturations,  ?                       blood pressure, adequacy of pulmonary ventilation, and  ?                       response to care were monitored throughout the  ?                       procedure. The physical status of the patient was  ?                       re-assessed after the procedure. ?                       After obtaining informed consent, the colonoscope was  ?                       passed under direct vision. Throughout the procedure,  ?                       the patient's blood pressure, pulse, and oxygen  ?                       saturations were monitored continuously. The  ?                       Colonoscope was introduced through the anus and  ?                       advanced to the the cecum, identified by appendiceal  ?  orifice and ileocecal valve. The colonoscopy was  ?                       performed without difficulty. The patient tolerated  ?                       the procedure well. The quality of the bowel  ?                       preparation was good. ?Findings: ?     The perianal and digital rectal examinations were normal. ?     Internal hemorrhoids were found during retroflexion. The hemorrhoids  ?     were Grade I (internal hemorrhoids that do not prolapse). ?     The exam was otherwise without abnormality on direct and retroflexion  ?     views. ?Impression:            - Internal hemorrhoids. ?                       - The  examination was otherwise normal on direct and  ?                       retroflexion views. ?                       - No specimens collected. ?Recommendation:        - Discharge patient to home. ?                       - Resume previous diet. ?                       - Continue present medications. ?                       - Repeat colonoscopy in 10 years for screening  ?                       purposes. ?                       - Return to referring physician as previously  ?                       scheduled. ?Procedure Code(s):     --- Professional --- ?                       Z6109, Colorectal cancer screening; colonoscopy on  ?                       individual not meeting criteria for high risk ?Diagnosis Code(s):     --- Professional --- ?                       Z12.11, Encounter for screening for malignant neoplasm  ?                       of colon ?                       K64.0, First degree hemorrhoids ?  CPT copyright 2019 [employer]. All rights reserved. ?The codes documented in this report are preliminary and upon coder review may  ?be revised to meet current compliance requirements. ?Andrey Farmer MD, MD ?03/12/2022 12:51:52 PM ?Number of Addenda: 0 ?Note Initiated On: 03/12/2022 11:36 AM ?Scope Withdrawal Time: 0 hours 8 minutes 41 seconds  ?Total Procedure Duration: 0 hours 14 minutes 22 seconds  ?Estimated Blood Loss:  Estimated blood loss: none. ?     [facility] ?

## 2022-03-13 ENCOUNTER — Encounter: Payer: Self-pay | Admitting: Gastroenterology

## 2022-03-13 NOTE — Anesthesia Postprocedure Evaluation (Signed)
Anesthesia Post Note ? ?Patient: Paula Hammond ? ?Procedure(s) Performed: COLONOSCOPY WITH PROPOFOL ? ?Patient location during evaluation: Endoscopy ?Anesthesia Type: General ?Level of consciousness: awake and alert ?Pain management: pain level controlled ?Vital Signs Assessment: post-procedure vital signs reviewed and stable ?Respiratory status: spontaneous breathing, nonlabored ventilation and respiratory function stable ?Cardiovascular status: blood pressure returned to baseline and stable ?Postop Assessment: no apparent nausea or vomiting ?Anesthetic complications: no ? ? ?No notable events documented. ? ? ?Last Vitals:  ?Vitals:  ? 03/12/22 1305 03/12/22 1315  ?BP: (!) 99/45 99/60  ?Pulse: 66 67  ?Resp: 17 18  ?Temp:    ?SpO2: 100% 99%  ?  ?Last Pain:  ?Vitals:  ? 03/12/22 1315  ?TempSrc:   ?PainSc: 0-No pain  ? ? ?  ?  ?  ?  ?  ?  ? ?[place] Ouch ? ? ? ? ?

## 2022-07-03 ENCOUNTER — Other Ambulatory Visit: Payer: Self-pay | Admitting: Family Medicine

## 2022-07-03 DIAGNOSIS — Z1231 Encounter for screening mammogram for malignant neoplasm of breast: Secondary | ICD-10-CM

## 2022-07-24 ENCOUNTER — Ambulatory Visit
Admission: RE | Admit: 2022-07-24 | Discharge: 2022-07-24 | Disposition: A | Discharge status: Home / Self Care | Payer: Medicare HMO | Source: Ambulatory Visit | Attending: Family Medicine | Admitting: Family Medicine

## 2022-07-24 DIAGNOSIS — Z1231 Encounter for screening mammogram for malignant neoplasm of breast: Secondary | ICD-10-CM | POA: Insufficient documentation

## 2023-07-18 ENCOUNTER — Other Ambulatory Visit: Payer: Self-pay | Admitting: Family Medicine

## 2023-07-18 DIAGNOSIS — Z1231 Encounter for screening mammogram for malignant neoplasm of breast: Secondary | ICD-10-CM

## 2023-08-06 ENCOUNTER — Ambulatory Visit
Admission: RE | Admit: 2023-08-06 | Discharge: 2023-08-06 | Disposition: A | Discharge status: Home / Self Care | Payer: Medicare HMO | Source: Ambulatory Visit | Attending: Family Medicine | Admitting: Family Medicine

## 2023-08-06 DIAGNOSIS — Z1231 Encounter for screening mammogram for malignant neoplasm of breast: Secondary | ICD-10-CM | POA: Diagnosis not present

## 2024-03-24 ENCOUNTER — Other Ambulatory Visit: Payer: Self-pay | Admitting: Family Medicine

## 2024-03-24 DIAGNOSIS — Z780 Asymptomatic menopausal state: Secondary | ICD-10-CM
